# Patient Record
Sex: Female | Born: 1975 | Race: White | Hispanic: No | Smoking: Current every day smoker
Health system: Southern US, Community
[De-identification: ages and names within clinical notes are randomized; demographics above are authoritative.]

## PROBLEM LIST (undated history)

## (undated) DIAGNOSIS — T7840XA Allergy, unspecified, initial encounter: Secondary | ICD-10-CM

## (undated) DIAGNOSIS — G5600 Carpal tunnel syndrome, unspecified upper limb: Secondary | ICD-10-CM

## (undated) DIAGNOSIS — L309 Dermatitis, unspecified: Secondary | ICD-10-CM

## (undated) DIAGNOSIS — F419 Anxiety disorder, unspecified: Secondary | ICD-10-CM

## (undated) DIAGNOSIS — M1990 Unspecified osteoarthritis, unspecified site: Secondary | ICD-10-CM

## (undated) DIAGNOSIS — F4310 Post-traumatic stress disorder, unspecified: Secondary | ICD-10-CM

## (undated) DIAGNOSIS — F32A Depression, unspecified: Secondary | ICD-10-CM

## (undated) DIAGNOSIS — G709 Myoneural disorder, unspecified: Secondary | ICD-10-CM

## (undated) DIAGNOSIS — G9389 Other specified disorders of brain: Secondary | ICD-10-CM

## (undated) DIAGNOSIS — F909 Attention-deficit hyperactivity disorder, unspecified type: Secondary | ICD-10-CM

## (undated) DIAGNOSIS — K589 Irritable bowel syndrome without diarrhea: Secondary | ICD-10-CM

## (undated) HISTORY — DX: Depression, unspecified: F32.A

## (undated) HISTORY — DX: Carpal tunnel syndrome, unspecified upper limb: G56.00

## (undated) HISTORY — DX: Allergy, unspecified, initial encounter: T78.40XA

## (undated) HISTORY — DX: Post-traumatic stress disorder, unspecified: F43.10

## (undated) HISTORY — DX: Anxiety disorder, unspecified: F41.9

## (undated) HISTORY — DX: Unspecified osteoarthritis, unspecified site: M19.90

## (undated) HISTORY — DX: Dermatitis, unspecified: L30.9

## (undated) HISTORY — DX: Irritable bowel syndrome, unspecified: K58.9

## (undated) HISTORY — DX: Attention-deficit hyperactivity disorder, unspecified type: F90.9

## (undated) HISTORY — DX: Myoneural disorder, unspecified: G70.9

## (undated) HISTORY — DX: Other specified disorders of brain: G93.89

---

## 2004-07-22 ENCOUNTER — Emergency Department: Payer: Self-pay | Admitting: Emergency Medicine

## 2004-07-31 ENCOUNTER — Emergency Department: Payer: Self-pay | Admitting: Unknown Physician Specialty

## 2005-12-28 ENCOUNTER — Emergency Department: Payer: Self-pay | Admitting: Unknown Physician Specialty

## 2006-04-11 ENCOUNTER — Emergency Department: Payer: Self-pay
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
## 2007-02-13 ENCOUNTER — Emergency Department: Payer: Self-pay | Admitting: Emergency Medicine

## 2007-04-29 ENCOUNTER — Emergency Department: Payer: Self-pay | Admitting: Emergency Medicine

## 2007-04-29 ENCOUNTER — Other Ambulatory Visit: Payer: Self-pay

## 2007-05-17 ENCOUNTER — Ambulatory Visit: Payer: Self-pay | Admitting: Internal Medicine

## 2007-07-18 ENCOUNTER — Emergency Department: Payer: Self-pay | Admitting: Emergency Medicine

## 2007-07-30 ENCOUNTER — Emergency Department: Payer: Self-pay | Admitting: Emergency Medicine

## 2007-07-30 ENCOUNTER — Other Ambulatory Visit: Payer: Self-pay

## 2007-08-02 ENCOUNTER — Emergency Department: Payer: Self-pay | Admitting: Emergency Medicine

## 2007-08-14 ENCOUNTER — Emergency Department: Payer: Self-pay | Admitting: Emergency Medicine

## 2007-08-19 ENCOUNTER — Emergency Department: Payer: Self-pay | Admitting: Internal Medicine

## 2007-10-02 ENCOUNTER — Emergency Department: Payer: Self-pay | Admitting: Emergency Medicine
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
## 2009-07-18 IMAGING — US ABDOMEN ULTRASOUND
1 series · 17 of 25 positions shown · non-contrast
Comparison: none

REASON FOR EXAM: RUQ pain, nausea and vomiting
COMMENTS:

[Series 1: abdomen ultrasound · 17 of 55 slices shown]
[im 1/55]
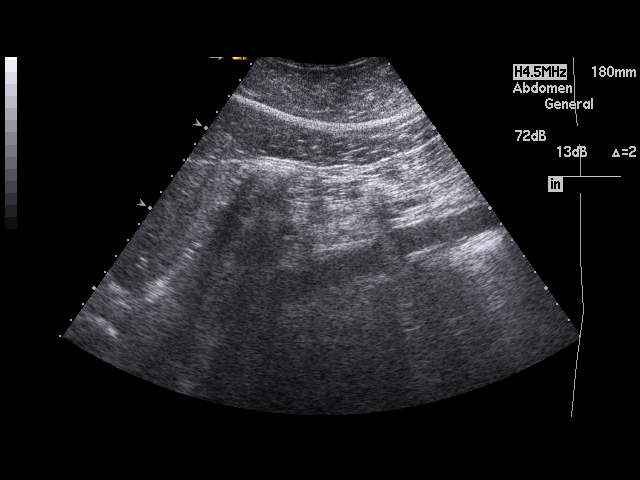
[im 5/55]
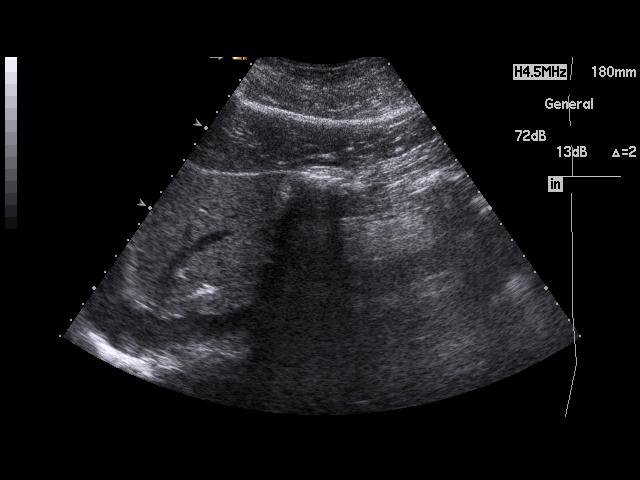
[im 7/55]
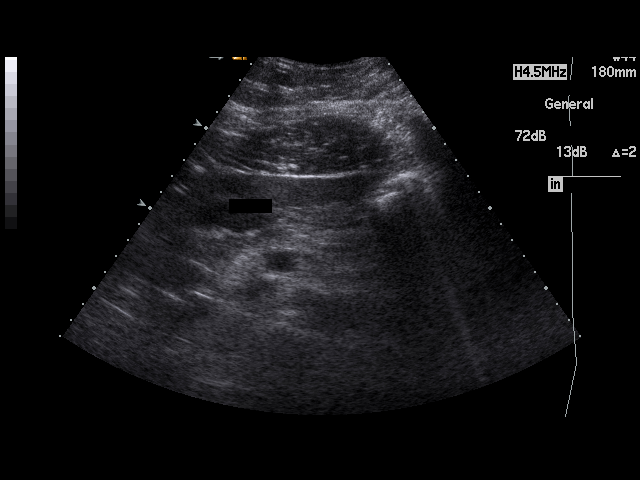
[im 12/55]
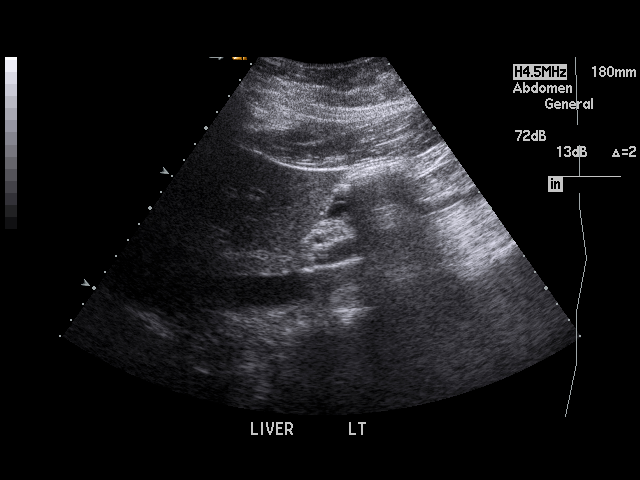
[im 14/55]
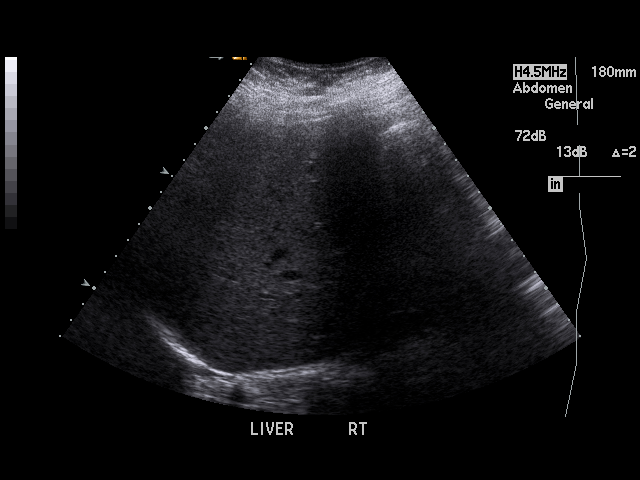
[im 19/55]
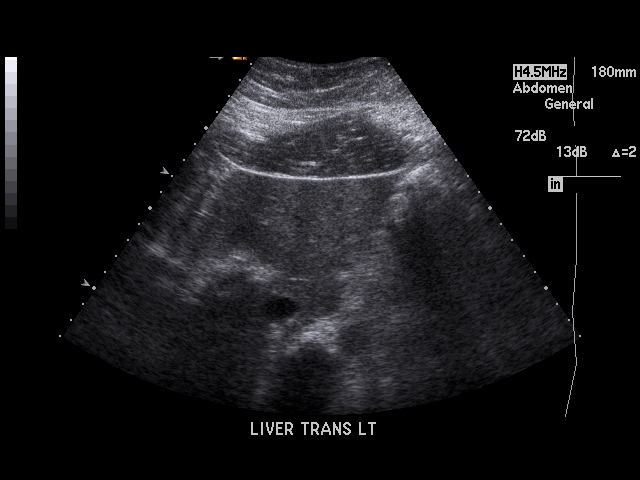
[im 21/55]
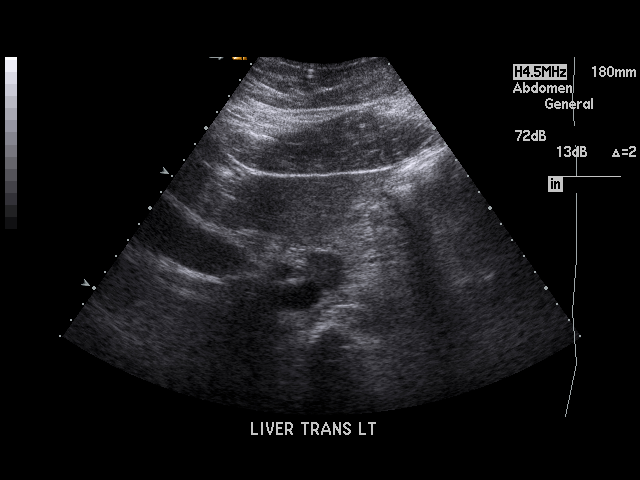
[im 25/55]
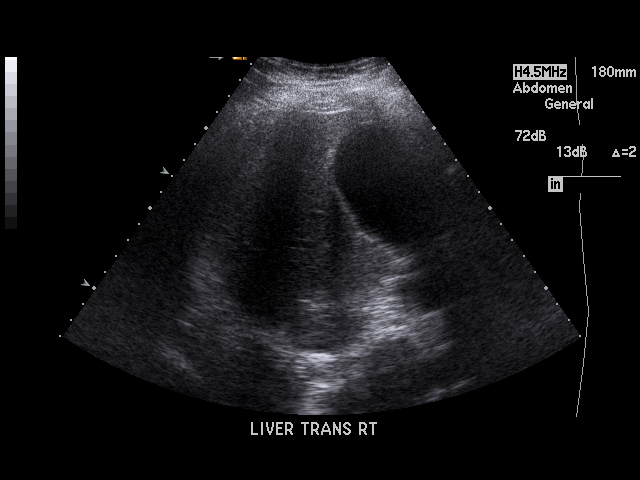
[im 28/55]
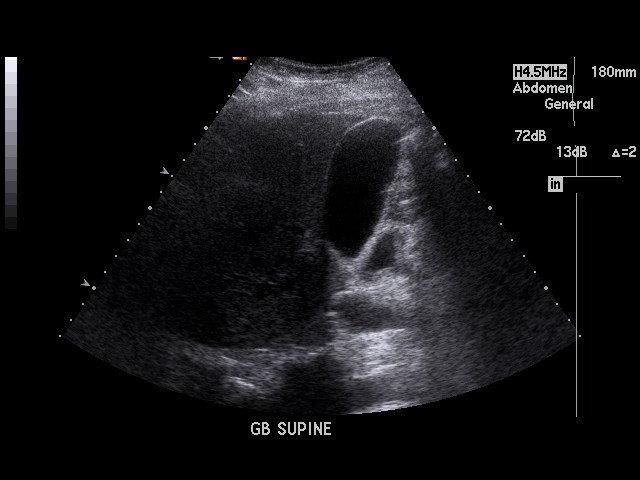
[im 30/55]
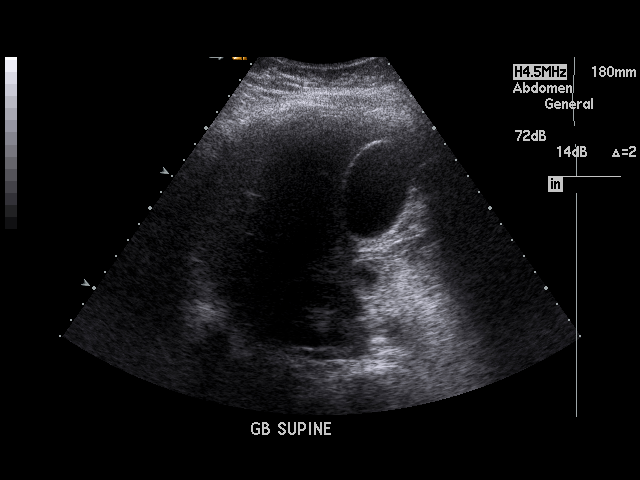
[im 34/55]
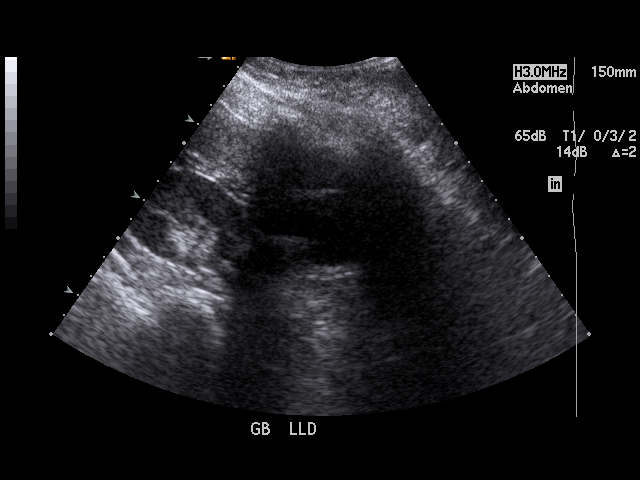
[im 37/55]
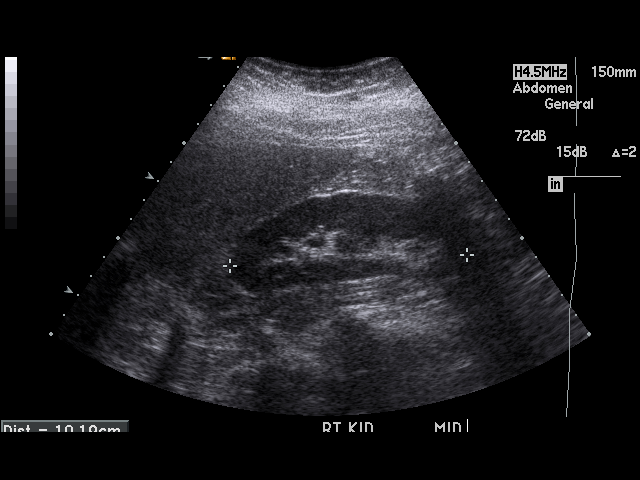
[im 41/55]
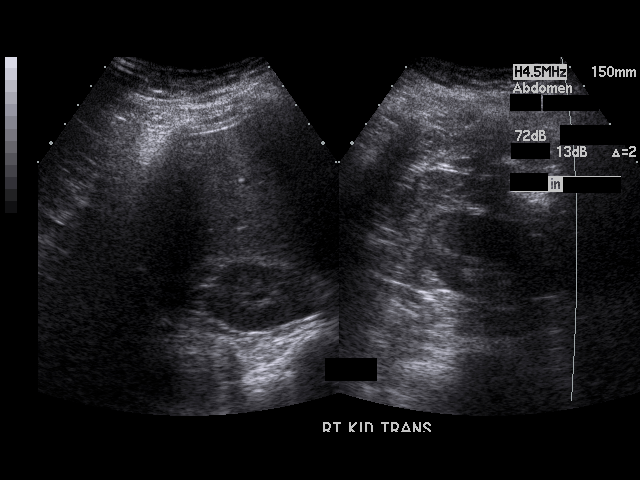
[im 43/55]
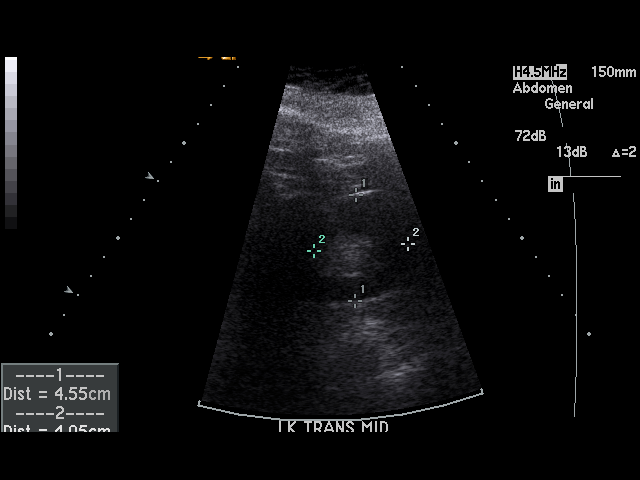
[im 48/55]
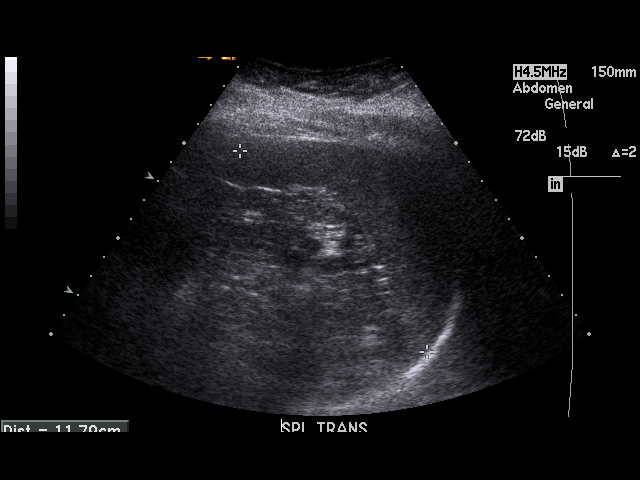
[im 50/55]
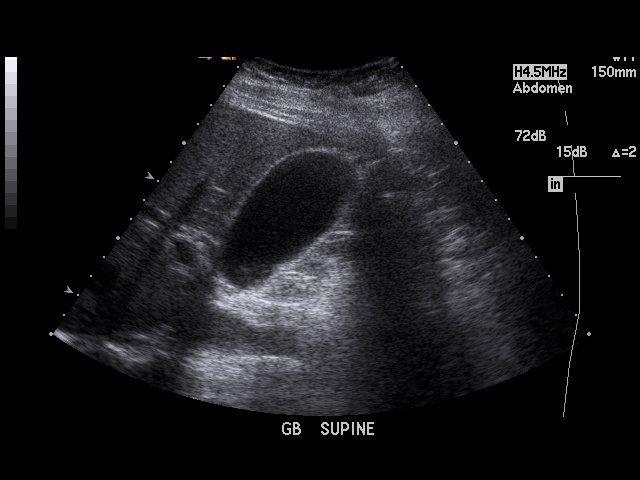
[im 55/55]
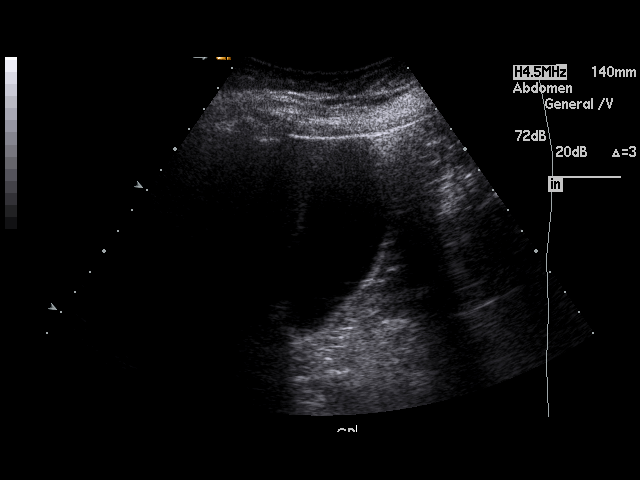

[17 of 25 positions shown; findings below may reference images not displayed]

PROCEDURE:     US  - US ABDOMEN GENERAL SURVEY  - August 02, 2007  [DATE]

RESULT:     The liver, spleen, pancreas, abdominal aorta and inferior vena
cava show no significant abnormalities. No gallstones are seen. There is no
thickening of the gallbladder wall. The common bile duct measures 3.4 mm in
diameter which is within normal limits. The kidneys show no hydronephrosis.
There is no ascites.
IMPRESSION: No significant abnormalities are noted.

## 2010-12-24 ENCOUNTER — Emergency Department: Payer: Self-pay | Admitting: Emergency Medicine

## 2011-09-19 ENCOUNTER — Emergency Department: Payer: Self-pay | Admitting: Emergency Medicine

## 2011-12-03 ENCOUNTER — Emergency Department: Payer: Self-pay | Admitting: Emergency Medicine

## 2012-06-02 ENCOUNTER — Emergency Department: Payer: Self-pay | Admitting: Unknown Physician Specialty

## 2012-06-02 LAB — CBC
HGB: 13.2 g/dL (ref 12.0–16.0)
MCHC: 33.4 g/dL (ref 32.0–36.0)
MCV: 91 fL (ref 80–100)
Platelet: 291 10*3/uL (ref 150–440)

## 2012-06-03 LAB — COMPREHENSIVE METABOLIC PANEL
Albumin: 3.7 g/dL (ref 3.4–5.0)
Alkaline Phosphatase: 77 U/L (ref 50–136)
Anion Gap: 8 (ref 7–16)
Bilirubin,Total: 0.1 mg/dL — ABNORMAL LOW (ref 0.2–1.0)
Co2: 23 mmol/L (ref 21–32)
Creatinine: 1.04 mg/dL (ref 0.60–1.30)
EGFR (Non-African Amer.): >60
Glucose: 91 mg/dL (ref 65–99)
Osmolality: 281 (ref 275–301)
Total Protein: 7.6 g/dL (ref 6.4–8.2)

## 2012-06-03 LAB — URINALYSIS, COMPLETE
Bacteria: NONE SEEN
RBC,UR: 82426 /HPF (ref 0–5)
Squamous Epithelial: 17
WBC UR: 73 /HPF (ref 0–5)

## 2012-11-22 DIAGNOSIS — Y249XXA Unspecified firearm discharge, undetermined intent, initial encounter: Secondary | ICD-10-CM | POA: Insufficient documentation

## 2013-04-18 LAB — BASIC METABOLIC PANEL
BUN: 14 mg/dL (ref 4–21)
Creatinine: 1 mg/dL (ref 0.5–1.1)
GLUCOSE: 87 mg/dL
Potassium: 4 mmol/L (ref 3.4–5.3)
Sodium: 140 mmol/L (ref 137–147)

## 2013-04-18 LAB — CBC AND DIFFERENTIAL
HEMATOCRIT: 40 % (ref 36–46)
HEMOGLOBIN: 13.3 g/dL (ref 12.0–16.0)
Neutrophils Absolute: 6 /uL
Platelets: 348 10*3/uL (ref 150–399)
WBC: 9.1 10^3/mL

## 2013-04-18 LAB — LIPID PANEL
Cholesterol: 163 mg/dL (ref 0–200)
HDL: 40 mg/dL (ref 35–70)
LDL CALC: 105 mg/dL
Triglycerides: 92 mg/dL (ref 40–160)

## 2013-04-18 LAB — HEPATIC FUNCTION PANEL
ALT: 13 U/L (ref 7–35)
AST: 12 U/L — AB (ref 13–35)
Alkaline Phosphatase: 65 U/L (ref 25–125)
Bilirubin, Total: 0.2 mg/dL

## 2013-04-18 LAB — TSH: TSH: 1.24 u[IU]/mL (ref 0.41–5.90)

## 2013-12-07 ENCOUNTER — Ambulatory Visit: Payer: Self-pay

## 2016-04-02 ENCOUNTER — Emergency Department
Admission: EM | Admit: 2016-04-02 | Discharge: 2016-04-02 | Disposition: A | Discharge status: Home / Self Care | Payer: Medicaid Other | Attending: Emergency Medicine | Admitting: Emergency Medicine

## 2016-04-02 ENCOUNTER — Encounter: Payer: Self-pay | Admitting: Emergency Medicine

## 2016-04-02 DIAGNOSIS — F909 Attention-deficit hyperactivity disorder, unspecified type: Secondary | ICD-10-CM | POA: Insufficient documentation

## 2016-04-02 DIAGNOSIS — F17210 Nicotine dependence, cigarettes, uncomplicated: Secondary | ICD-10-CM | POA: Insufficient documentation

## 2016-04-02 DIAGNOSIS — A084 Viral intestinal infection, unspecified: Secondary | ICD-10-CM | POA: Diagnosis not present

## 2016-04-02 DIAGNOSIS — R197 Diarrhea, unspecified: Secondary | ICD-10-CM | POA: Diagnosis present

## 2016-04-02 DIAGNOSIS — R112 Nausea with vomiting, unspecified: Secondary | ICD-10-CM

## 2016-04-02 MED ORDER — ONDANSETRON 4 MG PO TBDP: 4.0000 mg | ORAL_TABLET | Freq: Three times a day (TID) | ORAL | 0 refills | Status: DC | PRN

## 2016-04-02 MED ORDER — ONDANSETRON 4 MG PO TBDP
ORAL_TABLET | ORAL | Status: AC
  Filled 2016-04-02: qty 1

## 2016-04-02 MED ORDER — ONDANSETRON 4 MG PO TBDP
4.0000 mg | ORAL_TABLET | Freq: Once | ORAL | Status: AC
  Administered 2016-04-02: 4 mg via ORAL

## 2016-04-02 NOTE — Discharge Instructions (Signed)
Rest. Push fluids.   Return to the emergency department if any worsening, onset of blood in your stools, inability to take fluids by mouth.  Please follow up with your PCP or [facility] walk in for recheck in 1-2 days.

## 2016-04-02 NOTE — ED Triage Notes (Signed)
Reports n/v/d this am.  Pt states "im only here bc my boss said I have to have a work note."

## 2016-04-02 NOTE — ED Provider Notes (Signed)
[facility] Emergency Department Provider Note  ____________________________________________  Time seen: Approximately 11:52 AM  I have reviewed the triage vital signs and the nursing notes.   HISTORY  Chief Complaint Nausea    HPI Kaylee Mcguire is a 41 y.o. female , NAD, presents to the department for evaluation of nausea, vomiting and diarrhea. Patient states she had sudden onset nausea, vomiting and diarrhea at 5:30 this morning. He attempted to go to work at [employer] and was sent to the emergency department by her employer for evaluation. Patient states she has had some abdominal cramping which seems to be alleviated with bowel movement. Denies any mucous or blood in the emesis or her stool. States that a child in her home has also had the same symptoms and diagnosed with viral gastroenteritis. Denies any fevers but has had chills. Denies body aches, cough, chest congestion, chest pain, shortness of breath or wheezing. Has had no headaches, sinus pressure, ear pain or sore throat.   Past Medical History:  Diagnosis Date  . ADHD (attention deficit hyperactivity disorder)   . Eczema   . IBS (irritable bowel syndrome)   . PTSD (post-traumatic stress disorder)     There are no active problems to display for this patient.   History reviewed. No pertinent surgical history.  Prior to Admission medications   Medication Sig Start Date End Date Taking? Authorizing Provider  ondansetron (ZOFRAN ODT) 4 MG disintegrating tablet Take 1 tablet (4 mg total) by mouth every 8 (eight) hours as needed for nausea or vomiting. 04/02/16   Lyriq Jarchow L Shayda Kalka, PA-C    Allergies Codeine  Family History  Problem Relation Age of Onset  . Heart disease Father     Social History Social History  Substance Use Topics  . Smoking status: Current Every Day Smoker    Packs/day: 0.50    Types: Cigarettes  . Smokeless tobacco: Never Used  . Alcohol use No     Review of  Systems  Constitutional: Positive chills. No fever, rigors. Eyes: No visual changes.  ENT: No sore throat, nasal congestion, sinus pressure, runny nose. Cardiovascular: No chest pain, palpitations. Respiratory: No cough or chest congestion. No shortness of breath. No wheezing.  Gastrointestinal: Positive abdominal cramping that alleviates with bowel movement. Positive nausea, vomiting and diarrhea. No hematemesis or hematochezia. Constipation. Genitourinary: Negative for dysuria, hematuria. No urinary hesitancy, urgency or increased frequency. Musculoskeletal: Negative for back pain nor general myalgias.  Skin: Negative for rash. Neurological: Negative for headaches, focal weakness or numbness. 10-point ROS otherwise negative.  ____________________________________________   PHYSICAL EXAM:  VITAL SIGNS: ED Triage Vitals  Enc Vitals Group     BP 04/02/16 0924 (!) 139/94     Pulse Rate 04/02/16 0924 89     Resp 04/02/16 0924 18     Temp 04/02/16 0924 98 F (36.7 C)     Temp Source 04/02/16 0924 Oral     SpO2 04/02/16 0924 99 %     Weight 04/02/16 0925 235 lb (106.6 kg)     Height 04/02/16 0925 5\' 3"  (1.6 m)     Head Circumference --      Peak Flow --      Pain Score 04/02/16 0925 0     Pain Loc --      Pain Edu? --      Excl. in GC? --      Constitutional: Alert and oriented. Ill appearing but in no acute distress. Eyes: Conjunctivae are normal  without icterus, injection or discharge. Head: Atraumatic. Neck: Supple with full range of motion. Hematological/Lymphatic/Immunilogical: No cervical lymphadenopathy. Cardiovascular: Normal rate, regular rhythm. Normal S1 and S2.  No murmurs, rubs, gallops. Good peripheral circulation. Respiratory: Normal respiratory effort without tachypnea or retractions. Lungs CTAB with breath sounds noted in all lung fields. No wheeze, rhonchi, rales. Gastrointestinal: Soft and nontender without distention or guarding in all quadrants. No rebound  or rigidity. No masses or hepatosplenomegaly. Bowel sounds are hyperactive in the lower regions. Musculoskeletal: No lower extremity tenderness nor edema.  No joint effusions. Neurologic:  Normal speech and language. Auralgan posture. No gross focal neurologic deficits are appreciated.  Skin:  Skin is warm, dry and intact. No rash noted. Psychiatric: Mood and affect are normal. Speech and behavior are normal. Patient exhibits appropriate insight and judgement.   ____________________________________________   LABS  None ____________________________________________  EKG  None ____________________________________________  RADIOLOGY  None ____________________________________________    PROCEDURES  Procedure(s) performed: None   Procedures   Medications  ondansetron (ZOFRAN-ODT) disintegrating tablet 4 mg (4 mg Oral Given 04/02/16 [id number])   Patient notes no further nausea after being given Zofran ODT.  ____________________________________________   INITIAL IMPRESSION / ASSESSMENT AND PLAN / ED COURSE  Pertinent labs & imaging results that were available during my care of the patient were reviewed by me and considered in my medical decision making (see chart for details).  Clinical Course     Patient's diagnosis is consistent with Nausea, vomiting and diarrhea caused by viral gastroenteritis. Patient will be discharged home with prescriptions for Zofran ODT to take as needed for nausea and vomiting. Patient is encouraged to continue oral intake of water and Gatorade and follow a care as given. Patient is to follow up with her primary care provider in one to 2 days for recheck. Patient is given strict ED precautions to return to the ED for any worsening or new symptoms.    ____________________________________________  FINAL CLINICAL IMPRESSION(S) / ED DIAGNOSES  Final diagnoses:  Nausea vomiting and diarrhea  Viral gastroenteritis      NEW MEDICATIONS STARTED  DURING THIS VISIT:  Discharge Medication List as of 04/02/2016 11:58 AM    START taking these medications   Details  ondansetron (ZOFRAN ODT) 4 MG disintegrating tablet Take 1 tablet (4 mg total) by mouth every 8 (eight) hours as needed for nausea or vomiting., Starting Fri 04/02/2016, Print             Ernestene KielJami L WarrenHagler, PA-C 04/02/16 1733    Emily FilbertJonathan E Williams, MD 04/03/16 1501

## 2016-04-02 NOTE — ED Notes (Signed)
Patient was called for in the designated waiting area but patient did not answer. 

## 2016-07-20 ENCOUNTER — Emergency Department
Admission: EM | Admit: 2016-07-20 | Discharge: 2016-07-20 | Disposition: A | Discharge status: Home / Self Care | Payer: Medicaid Other | Attending: Emergency Medicine | Admitting: Emergency Medicine

## 2016-07-20 ENCOUNTER — Emergency Department: Payer: Medicaid Other

## 2016-07-20 ENCOUNTER — Encounter: Payer: Self-pay | Admitting: Emergency Medicine

## 2016-07-20 DIAGNOSIS — F17210 Nicotine dependence, cigarettes, uncomplicated: Secondary | ICD-10-CM | POA: Insufficient documentation

## 2016-07-20 DIAGNOSIS — R109 Unspecified abdominal pain: Secondary | ICD-10-CM | POA: Diagnosis present

## 2016-07-20 DIAGNOSIS — Z79899 Other long term (current) drug therapy: Secondary | ICD-10-CM | POA: Diagnosis not present

## 2016-07-20 DIAGNOSIS — F909 Attention-deficit hyperactivity disorder, unspecified type: Secondary | ICD-10-CM | POA: Diagnosis not present

## 2016-07-20 DIAGNOSIS — N200 Calculus of kidney: Secondary | ICD-10-CM | POA: Insufficient documentation

## 2016-07-20 LAB — COMPREHENSIVE METABOLIC PANEL
ALBUMIN: 3.9 g/dL (ref 3.5–5.0)
ALT: 16 U/L (ref 14–54)
AST: 20 U/L (ref 15–41)
Alkaline Phosphatase: 56 U/L (ref 38–126)
Anion gap: 7 (ref 5–15)
BILIRUBIN TOTAL: 0.6 mg/dL (ref 0.3–1.2)
BUN: 15 mg/dL (ref 6–20)
CO2: 28 mmol/L (ref 22–32)
Calcium: 9.1 mg/dL (ref 8.9–10.3)
Chloride: 105 mmol/L (ref 101–111)
Creatinine, Ser: 0.87 mg/dL (ref 0.44–1.00)
GFR calc Af Amer: >60 mL/min (ref >60)
GFR calc non Af Amer: >60 mL/min (ref >60)
GLUCOSE: 100 mg/dL — AB (ref 65–99)
POTASSIUM: 4.2 mmol/L (ref 3.5–5.1)
Sodium: 140 mmol/L (ref 135–145)
TOTAL PROTEIN: 7.5 g/dL (ref 6.5–8.1)

## 2016-07-20 LAB — URINALYSIS, COMPLETE (UACMP) WITH MICROSCOPIC
BACTERIA UA: NONE SEEN
Bilirubin Urine: NEGATIVE
Glucose, UA: NEGATIVE mg/dL
Ketones, ur: NEGATIVE mg/dL
Leukocytes, UA: NEGATIVE
NITRITE: NEGATIVE
PROTEIN: 100 mg/dL — AB
Specific Gravity, Urine: 1.021 (ref 1.005–1.030)
pH: 6 (ref 5.0–8.0)

## 2016-07-20 LAB — CBC
HEMATOCRIT: 40.7 % (ref 35.0–47.0)
Hemoglobin: 13.7 g/dL (ref 12.0–16.0)
MCH: 29.8 pg (ref 26.0–34.0)
MCHC: 33.7 g/dL (ref 32.0–36.0)
MCV: 88.5 fL (ref 80.0–100.0)
Platelets: 329 10*3/uL (ref 150–440)
RBC: 4.6 MIL/uL (ref 3.80–5.20)
RDW: 13.8 % (ref 11.5–14.5)
WBC: 10.5 10*3/uL (ref 3.6–11.0)

## 2016-07-20 LAB — LIPASE, BLOOD: Lipase: 29 U/L (ref 11–51)

## 2016-07-20 LAB — POCT PREGNANCY, URINE: PREG TEST UR: NEGATIVE

## 2016-07-20 MED ORDER — KETOROLAC TROMETHAMINE 30 MG/ML IJ SOLN
30.0000 mg | Freq: Once | INTRAMUSCULAR | Status: AC
  Administered 2016-07-20: 30 mg via INTRAVENOUS
  Filled 2016-07-20: qty 1

## 2016-07-20 MED ORDER — ONDANSETRON HCL 4 MG/2ML IJ SOLN
4.0000 mg | Freq: Once | INTRAMUSCULAR | Status: AC
  Administered 2016-07-20: 4 mg via INTRAVENOUS
  Filled 2016-07-20: qty 2

## 2016-07-20 MED ORDER — ONDANSETRON HCL 4 MG/2ML IJ SOLN
4.0000 mg | Freq: Once | INTRAMUSCULAR | Status: AC | PRN
  Administered 2016-07-20: 4 mg via INTRAVENOUS
  Filled 2016-07-20: qty 2

## 2016-07-20 MED ORDER — ONDANSETRON HCL 4 MG PO TABS: 4.0000 mg | ORAL_TABLET | Freq: Every day | ORAL | 1 refills | Status: AC | PRN

## 2016-07-20 MED ORDER — MORPHINE SULFATE (PF) 4 MG/ML IV SOLN
4.0000 mg | Freq: Once | INTRAVENOUS | Status: AC
  Administered 2016-07-20: 4 mg via INTRAVENOUS
  Filled 2016-07-20: qty 1

## 2016-07-20 MED ORDER — SODIUM CHLORIDE 0.9 % IV BOLUS (SEPSIS)
1000.0000 mL | Freq: Once | INTRAVENOUS | Status: AC
  Administered 2016-07-20: 1000 mL via INTRAVENOUS

## 2016-07-20 MED ORDER — FENTANYL CITRATE (PF) 100 MCG/2ML IJ SOLN
50.0000 ug | INTRAMUSCULAR | Status: DC | PRN
  Administered 2016-07-20: 50 ug via INTRAVENOUS
  Filled 2016-07-20: qty 2

## 2016-07-20 MED ORDER — TAMSULOSIN HCL 0.4 MG PO CAPS: 0.4000 mg | ORAL_CAPSULE | Freq: Every day | ORAL | 0 refills | Status: DC

## 2016-07-20 MED ORDER — OXYCODONE-ACETAMINOPHEN 5-325 MG PO TABS: 1.0000 | ORAL_TABLET | Freq: Four times a day (QID) | ORAL | 0 refills | Status: DC | PRN

## 2016-07-20 NOTE — Discharge Instructions (Signed)
Please follow up with urology for further evaluation of your kidney stone.  °

## 2016-07-20 NOTE — ED Notes (Signed)
Pt discharged home after verbalizing understanding of discharge instructions; nad noted. 

## 2016-07-20 NOTE — ED Provider Notes (Signed)
[facility] Emergency Department Provider Note   ____________________________________________   First MD Initiated Contact with Patient 07/20/16 0259     (approximate)  I have reviewed the triage vital signs and the nursing notes.   HISTORY  Chief Complaint Abdominal Pain    HPI Kaylee Mcguire is a 41 y.o. female who comes into the hospital today with some right-sided abdominal and flank pain. The patient woke up at 2 AM with sharp pain in her right side. The patient thought it was due to gas. She had a bowel movement reports that the pain is getting worse and worse. The patient did not take anything for pain. She rates her pain a 10 out of 10 in intensity. She's had some nausea with no vomiting. The pain also wraps around to her back. The patient has never had anything like this in the past. She does have a history of ovarian cysts and fibroids. The patient's last menstrual period was last week. She has stabbing pain but denies pain with urination or blood in her urine. The patient's here today for evaluation.   Past Medical History:  Diagnosis Date  . ADHD (attention deficit hyperactivity disorder)   . Eczema   . IBS (irritable bowel syndrome)   . PTSD (post-traumatic stress disorder)     There are no active problems to display for this patient.   History reviewed. No pertinent surgical history.  Prior to Admission medications   Medication Sig Start Date End Date Taking? Authorizing Provider  ondansetron (ZOFRAN ODT) 4 MG disintegrating tablet Take 1 tablet (4 mg total) by mouth every 8 (eight) hours as needed for nausea or vomiting. 04/02/16   Jami L Hagler, PA-C  ondansetron (ZOFRAN) 4 MG tablet Take 1 tablet (4 mg total) by mouth daily as needed for nausea or vomiting. 07/20/16 07/20/17  Rebecka Apley, MD  oxyCODONE-acetaminophen (ROXICET) 5-325 MG tablet Take 1 tablet by mouth every 6 (six) hours as needed. 07/20/16   Rebecka Apley, MD    tamsulosin (FLOMAX) 0.4 MG CAPS capsule Take 1 capsule (0.4 mg total) by mouth daily. 07/20/16   Rebecka Apley, MD    Allergies Codeine  Family History  Problem Relation Age of Onset  . Heart disease Father     Social History Social History  Substance Use Topics  . Smoking status: Current Every Day Smoker    Packs/day: 0.50    Types: Cigarettes  . Smokeless tobacco: Never Used  . Alcohol use No    Review of Systems  Constitutional: No fever/chills Eyes: No visual changes. ENT: No sore throat. Cardiovascular: Denies chest pain. Respiratory: Denies shortness of breath. Gastrointestinal:  abdominal pain, nausea, no vomiting.  No diarrhea.  No constipation. Genitourinary: Negative for dysuria. Musculoskeletal: Right flank pain Skin: Negative for rash. Neurological: Negative for headaches, focal weakness or numbness.   ____________________________________________   PHYSICAL EXAM:  VITAL SIGNS: ED Triage Vitals  Enc Vitals Group     BP 07/20/16 0226 (!) 159/107     Pulse Rate 07/20/16 0226 79     Resp 07/20/16 0226 (!) 22     Temp 07/20/16 0226 98.2 F (36.8 C)     Temp Source 07/20/16 0226 Oral     SpO2 07/20/16 0226 99 %     Weight 07/20/16 0227 255 lb (115.7 kg)     Height 07/20/16 0227  (1.6 m)     Head Circumference --      Peak  Flow --      Pain Score 07/20/16 0226 10     Pain Loc --      Pain Edu? --      Excl. in GC? --     Constitutional: Alert and oriented. Well appearing and in Moderate distress. Eyes: Conjunctivae are normal. PERRL. EOMI. Head: Atraumatic. Nose: No congestion/rhinnorhea. Mouth/Throat: Mucous membranes are moist.  Oropharynx non-erythematous. Cardiovascular: Normal rate, regular rhythm. Grossly normal heart sounds.  Good peripheral circulation. Respiratory: Normal respiratory effort.  No retractions. Lungs CTAB. Gastrointestinal: Soft with some right sided abdominal tenderness to palpation No distention. Positive bowel  sounds right CVA tenderness palpation Musculoskeletal: No lower extremity tenderness nor edema.   Neurologic:  Normal speech and language.  Skin:  Skin is warm, dry and intact.  Psychiatric: Mood and affect are normal.   ____________________________________________   LABS (all labs ordered are listed, but only abnormal results are displayed)  Labs Reviewed  COMPREHENSIVE METABOLIC PANEL - Abnormal; Notable for the following:       Result Value   Glucose, Bld 100 (*)    All other components within normal limits  URINALYSIS, COMPLETE (UACMP) WITH MICROSCOPIC - Abnormal; Notable for the following:    Color, Urine AMBER (*)    APPearance CLOUDY (*)    Hgb urine dipstick LARGE (*)    Protein, ur 100 (*)    Squamous Epithelial / LPF 6-30 (*)    All other components within normal limits  LIPASE, BLOOD  CBC  POC URINE PREG, ED  POCT PREGNANCY, URINE   ____________________________________________  EKG  none ____________________________________________  RADIOLOGY  CT renal stone study ____________________________________________   PROCEDURES  Procedure(s) performed: None  Procedures  Critical Care performed: No  ____________________________________________   INITIAL IMPRESSION / ASSESSMENT AND PLAN / ED COURSE  Pertinent labs & imaging results that were available during my care of the patient were reviewed by me and considered in my medical decision making (see chart for details).  This is a 41 year old female who comes into the hospital today with some right sided abdominal pain and flank pain. I will send the patient for a CT scan looking for possible kidney stone. The patient did receive a dose of morphine when she initially arrived. I will give the patient some normal saline as well.  Clinical Course as of Jul 20 656  Tue Jul 20, 2016  0601 1. Obstructing 2.5 mm proximal right ureteral calculus at the L3-4 level with marked hydronephrosis. 2. Nonobstructing 4  x 5 mm lower pole left collecting system calculus. 3. Small fat containing umbilical and left inguinal hernias.   CT Renal Soundra Pilon [AW]    Clinical Course User Index [AW] Rebecka Apley, MD    The patient reports that she is feeling a little bit better. She does have an obstructing 2.5 mm proximal right ureteral stone the patient did receive a dose of Toradol. Her urine does not show any infection. The patient will be discharged home when she has completed her normal saline bolus. ____________________________________________   FINAL CLINICAL IMPRESSION(S) / ED DIAGNOSES  Final diagnoses:  Kidney stone      NEW MEDICATIONS STARTED DURING THIS VISIT:  New Prescriptions   ONDANSETRON (ZOFRAN) 4 MG TABLET    Take 1 tablet (4 mg total) by mouth daily as needed for nausea or vomiting.   OXYCODONE-ACETAMINOPHEN (ROXICET) 5-325 MG TABLET    Take 1 tablet by mouth every 6 (six) hours as needed.   TAMSULOSIN (  FLOMAX) 0.4 MG CAPS CAPSULE    Take 1 capsule (0.4 mg total) by mouth daily.     Note:  This document was prepared using Dragon voice recognition software and may include unintentional dictation errors.    Rebecka Apley, MD 07/20/16 [phone]

## 2016-07-20 NOTE — ED Triage Notes (Signed)
Patient to ER for c/o severe RLQ abd pain. Patient states pain woke her from sleep. States when she went to bed she didn't have pain, but felt like she may have had gas. Patient tearful in triage.

## 2016-08-18 ENCOUNTER — Ambulatory Visit (INDEPENDENT_AMBULATORY_CARE_PROVIDER_SITE_OTHER): Payer: Medicaid Other | Admitting: Urology

## 2016-08-18 ENCOUNTER — Encounter: Payer: Self-pay | Admitting: Urology

## 2016-08-18 VITALS — BP 126/81 | HR 88 | Ht 63.0 in | Wt 253.9 lb

## 2016-08-18 DIAGNOSIS — N200 Calculus of kidney: Secondary | ICD-10-CM | POA: Diagnosis not present

## 2016-08-18 LAB — MICROSCOPIC EXAMINATION

## 2016-08-18 LAB — URINALYSIS, COMPLETE
BILIRUBIN UA: NEGATIVE
Glucose, UA: NEGATIVE
KETONES UA: NEGATIVE
LEUKOCYTES UA: NEGATIVE
Nitrite, UA: NEGATIVE
PH UA: 5 (ref 5.0–7.5)
PROTEIN UA: NEGATIVE
Urobilinogen, Ur: 0.2 mg/dL (ref 0.2–1.0)

## 2016-08-18 MED ORDER — TAMSULOSIN HCL 0.4 MG PO CAPS: 0.4000 mg | ORAL_CAPSULE | Freq: Every day | ORAL | 0 refills | Status: DC

## 2016-08-18 NOTE — Progress Notes (Signed)
08/18/2016 2:33 PM   Kaylee Mcguire 18-Jul-1975 [id number]  Referring provider: [facility] [address] [place], [place] [address]  Chief Complaint  Patient presents with  . Nephrolithiasis    HPI: The patient is a 41 year old female presents for ER follow up after being diagnosed the tune of millimeter right ureteral calculus with hydroureteronephrosis proximal to the sotne. This was seen in the proximal ureter on 07/20/2016. She also had a 5 mm left lower pole stone that is nonobstructing. This is her first stone episode. Her pains is dramatically better at this time and not requiring medication. She doesn't time still have tenderness in her right side. This is similar but not nearly severe as her pain was during her acute stone episode. She has no nausea vomiting or fever or chills at this time. She is currently comfortable. She has no other issues with her kidneys. She denies gross hematuria or UTIs   PMH: Past Medical History:  Diagnosis Date  . ADHD (attention deficit hyperactivity disorder)   . Eczema   . IBS (irritable bowel syndrome)   . PTSD (post-traumatic stress disorder)     Surgical History: History reviewed. No pertinent surgical history.  Home Medications:  Allergies as of 08/18/2016      Reactions   Codeine       Medication List       Accurate as of 08/18/16  2:33 PM. Always use your most recent med list.          cetirizine 10 MG tablet Commonly known as:  ZYRTEC Take 10 mg by mouth daily.   lisinopril 10 MG tablet Commonly known as:  PRINIVIL,ZESTRIL Take 10 mg by mouth daily.   ondansetron 4 MG tablet Commonly known as:  ZOFRAN Take 1 tablet (4 mg total) by mouth daily as needed for nausea or vomiting.   oxyCODONE-acetaminophen 5-325 MG tablet Commonly known as:  ROXICET Take 1 tablet by mouth every 6 (six) hours as needed.   sertraline 50 MG tablet Commonly known as:  ZOLOFT Take 50 mg by mouth  daily.   tamsulosin 0.4 MG Caps capsule Commonly known as:  FLOMAX Take 1 capsule (0.4 mg total) by mouth daily.   tamsulosin 0.4 MG Caps capsule Commonly known as:  FLOMAX Take 1 capsule (0.4 mg total) by mouth daily.   traZODone 100 MG tablet Commonly known as:  DESYREL Take 100 mg by mouth at bedtime.   triamcinolone cream 0.1 % Commonly known as:  KENALOG Apply 1 application topically 2 (two) times daily.       Allergies:  Allergies  Allergen Reactions  . Codeine     Family History: Family History  Problem Relation Age of Onset  . Heart disease Father   . Bladder Cancer Neg Hx   . Kidney cancer Neg Hx     Social History:  reports that she has been smoking Cigarettes.  She has been smoking about 0.50 packs per day. She has never used smokeless tobacco. She reports that she does not drink alcohol or use drugs.  ROS: UROLOGY Frequent Urination?: No Hard to postpone urination?: No Burning/pain with urination?: No Get up at night to urinate?: No Leakage of urine?: No Urine stream starts and stops?: No Trouble starting stream?: No Do you have to strain to urinate?: No Blood in urine?: No Urinary tract infection?: No Sexually transmitted disease?: No Injury to kidneys or bladder?: No Painful intercourse?: No Weak stream?: No Currently pregnant?:  No Vaginal bleeding?: No Last menstrual period?: n  Gastrointestinal Nausea?: No Vomiting?: No Indigestion/heartburn?: No Diarrhea?: No Constipation?: No  Constitutional Fever: No Night sweats?: No Weight loss?: No Fatigue?: No  Skin Skin rash/lesions?: Yes Itching?: No  Eyes Blurred vision?: No Double vision?: No  Ears/Nose/Throat Sore throat?: No Sinus problems?: No  Hematologic/Lymphatic Swollen glands?: No Easy bruising?: No  Cardiovascular Leg swelling?: Yes Chest pain?: No  Respiratory Cough?: No Shortness of breath?: No  Endocrine Excessive thirst?: No  Musculoskeletal Back  pain?: No Joint pain?: No  Neurological Headaches?: Yes Dizziness?: No  Psychologic Depression?: Yes Anxiety?: Yes  Physical Exam: BP 126/81 (BP Location: Left Arm, Patient Position: Sitting, Cuff Size: Normal)   Pulse 88   Ht 5\' 3"  (1.6 m)   Wt 253 lb 14.4 oz (115.2 kg)   BMI 44.98 kg/m   Constitutional:  Alert and oriented, No acute distress. HEENT: [place] AT, moist mucus membranes.  Trachea midline, no masses. Cardiovascular: No clubbing, cyanosis, or edema. Respiratory: Normal respiratory effort, no increased work of breathing. GI: Abdomen is soft, nontender, nondistended, no abdominal masses GU: Mild right CVA tenderness.  Skin: No rashes, bruises or suspicious lesions. Lymph: No cervical or inguinal adenopathy. Neurologic: Grossly intact, no focal deficits, moving all 4 extremities. Psychiatric: Normal mood and affect.  Laboratory Data: Lab Results  Component Value Date   WBC 10.5 07/20/2016   HGB 13.7 07/20/2016   HCT 40.7 07/20/2016   MCV 88.5 07/20/2016   PLT 329 07/20/2016    Lab Results  Component Value Date   CREATININE 0.87 07/20/2016    No results found for: PSA  No results found for: TESTOSTERONE  No results found for: HGBA1C  Urinalysis    Component Value Date/Time   COLORURINE AMBER (A) 07/20/2016 0408   APPEARANCEUR CLOUDY (A) 07/20/2016 0408   APPEARANCEUR CLOUDY 06/02/2012 2323   LABSPEC 1.021 07/20/2016 0408   LABSPEC 1.030 06/02/2012 2323   PHURINE 6.0 07/20/2016 0408   GLUCOSEU NEGATIVE 07/20/2016 0408   GLUCOSEU see comment 06/02/2012 2323   HGBUR LARGE (A) 07/20/2016 0408   BILIRUBINUR NEGATIVE 07/20/2016 0408   BILIRUBINUR see comment 06/02/2012 2323   KETONESUR NEGATIVE 07/20/2016 0408   PROTEINUR 100 (A) 07/20/2016 0408   NITRITE NEGATIVE 07/20/2016 0408   LEUKOCYTESUR NEGATIVE 07/20/2016 0408   LEUKOCYTESUR see comment 06/02/2012 2323    Pertinent Imaging: CT reviewed as above  Assessment & Plan:    1. 2.5 mm right  ureteral stone I discussed treatment options with the patient including medical expulsive therapy, lithotripsy, and ureteroscopy. At this point, she would like to continue with medical expulsive therapy as she is minimally symptomatic. I'm not confident she has passed a stone though it is quite small due to her mild CVA tenderness. Due to the small size, like for her to get a ultrasound in a few weeks to ensure that her hydronephrosis resolves. I will resume her Flomax which she ran out of a few weeks ago. She'll follow-up with us in a few weeks with a renal ultrasound prior. She instructed to call the office if she develops uncontrolled pain or unexplained fevers.  2. 5 mm left lower pole nonobstructing stone -Will need to monitor size with annual KUB  Return in about 2 weeks (around 09/01/2016) for renal u/s prior.  Hildred LaserBrian James Lun Muro, MD  [facility] [address], [address] [place], [place] [address] [phone]

## 2016-09-16 ENCOUNTER — Other Ambulatory Visit: Payer: Self-pay | Admitting: Family Medicine

## 2016-09-17 ENCOUNTER — Ambulatory Visit: Payer: Medicaid Other

## 2016-10-04 ENCOUNTER — Encounter: Payer: Self-pay | Admitting: Emergency Medicine

## 2016-10-04 ENCOUNTER — Emergency Department
Admission: EM | Admit: 2016-10-04 | Discharge: 2016-10-04 | Disposition: A | Discharge status: Home / Self Care | Payer: Self-pay | Attending: Emergency Medicine | Admitting: Emergency Medicine

## 2016-10-04 ENCOUNTER — Emergency Department: Payer: Self-pay

## 2016-10-04 DIAGNOSIS — M25512 Pain in left shoulder: Secondary | ICD-10-CM | POA: Insufficient documentation

## 2016-10-04 DIAGNOSIS — Z79899 Other long term (current) drug therapy: Secondary | ICD-10-CM | POA: Insufficient documentation

## 2016-10-04 DIAGNOSIS — F17210 Nicotine dependence, cigarettes, uncomplicated: Secondary | ICD-10-CM | POA: Insufficient documentation

## 2016-10-04 MED ORDER — TRAMADOL HCL 50 MG PO TABS: 50.0000 mg | ORAL_TABLET | Freq: Four times a day (QID) | ORAL | 0 refills | Status: DC | PRN

## 2016-10-04 MED ORDER — KETOROLAC TROMETHAMINE 60 MG/2ML IM SOLN
60.0000 mg | Freq: Once | INTRAMUSCULAR | Status: AC
  Administered 2016-10-04: 60 mg via INTRAMUSCULAR
  Filled 2016-10-04: qty 2

## 2016-10-04 MED ORDER — NAPROXEN 500 MG PO TABS: 500.0000 mg | ORAL_TABLET | Freq: Two times a day (BID) | ORAL | Status: DC
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
## 2016-10-04 NOTE — Discharge Instructions (Signed)
Wear arm sling for 2-3 days as directed. Take medication as directed.

## 2016-10-04 NOTE — ED Triage Notes (Signed)
Pt presents with fall yesterday at work and had left shoulder pain. No deformity noted and pt does want to file WC.

## 2016-10-04 NOTE — ED Notes (Signed)
Sling applied and pt ambulatory at time of discharge. Pt in NAD. Prescriptions explained and pt verbalized understanding of pain medication and use. Follow up care also explained and understanding verbalized.
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
## 2018-04-13 ENCOUNTER — Emergency Department: Payer: Self-pay

## 2018-04-13 ENCOUNTER — Emergency Department
Admission: EM | Admit: 2018-04-13 | Discharge: 2018-04-14 | Disposition: A | Discharge status: Home / Self Care | Payer: Self-pay | Attending: Emergency Medicine | Admitting: Emergency Medicine

## 2018-04-13 DIAGNOSIS — Z79899 Other long term (current) drug therapy: Secondary | ICD-10-CM | POA: Insufficient documentation

## 2018-04-13 DIAGNOSIS — F17210 Nicotine dependence, cigarettes, uncomplicated: Secondary | ICD-10-CM | POA: Insufficient documentation

## 2018-04-13 DIAGNOSIS — Y9259 Other trade areas as the place of occurrence of the external cause: Secondary | ICD-10-CM | POA: Insufficient documentation

## 2018-04-13 DIAGNOSIS — S39012A Strain of muscle, fascia and tendon of lower back, initial encounter: Secondary | ICD-10-CM | POA: Insufficient documentation

## 2018-04-13 DIAGNOSIS — X58XXXA Exposure to other specified factors, initial encounter: Secondary | ICD-10-CM | POA: Insufficient documentation

## 2018-04-13 DIAGNOSIS — F909 Attention-deficit hyperactivity disorder, unspecified type: Secondary | ICD-10-CM | POA: Insufficient documentation

## 2018-04-13 DIAGNOSIS — Y9389 Activity, other specified: Secondary | ICD-10-CM | POA: Insufficient documentation

## 2018-04-13 DIAGNOSIS — Y999 Unspecified external cause status: Secondary | ICD-10-CM | POA: Insufficient documentation

## 2018-04-13 LAB — BASIC METABOLIC PANEL
Anion gap: 5 (ref 5–15)
BUN: 18 mg/dL (ref 6–20)
CALCIUM: 8.2 mg/dL — AB (ref 8.9–10.3)
CO2: 27 mmol/L (ref 22–32)
CREATININE: 1.06 mg/dL — AB (ref 0.44–1.00)
Chloride: 106 mmol/L (ref 98–111)
GFR calc non Af Amer: >60 mL/min (ref >60)
GLUCOSE: 102 mg/dL — AB (ref 70–99)
Potassium: 4.3 mmol/L (ref 3.5–5.1)
Sodium: 138 mmol/L (ref 135–145)

## 2018-04-13 LAB — CBC
HEMATOCRIT: 41.3 % (ref 36.0–46.0)
Hemoglobin: 13.4 g/dL (ref 12.0–15.0)
MCH: 29.8 pg (ref 26.0–34.0)
MCHC: 32.4 g/dL (ref 30.0–36.0)
MCV: 91.8 fL (ref 80.0–100.0)
Platelets: 312 10*3/uL (ref 150–400)
RBC: 4.5 MIL/uL (ref 3.87–5.11)
RDW: 13.4 % (ref 11.5–15.5)
WBC: 13.8 10*3/uL — AB (ref 4.0–10.5)
nRBC: 0 % (ref 0.0–0.2)

## 2018-04-13 LAB — POCT PREGNANCY, URINE: Preg Test, Ur: NEGATIVE

## 2018-04-13 MED ORDER — HALOPERIDOL LACTATE 5 MG/ML IJ SOLN
2.5000 mg | Freq: Once | INTRAMUSCULAR | Status: AC
  Administered 2018-04-14: 2.5 mg via INTRAVENOUS
  Filled 2018-04-13: qty 1

## 2018-04-13 MED ORDER — KETOROLAC TROMETHAMINE 30 MG/ML IJ SOLN
15.0000 mg | Freq: Once | INTRAMUSCULAR | Status: AC
  Administered 2018-04-14: 15 mg via INTRAVENOUS
  Filled 2018-04-13: qty 1

## 2018-04-13 MED ORDER — FENTANYL CITRATE (PF) 100 MCG/2ML IJ SOLN
100.0000 ug | Freq: Once | INTRAMUSCULAR | Status: AC
  Administered 2018-04-14: 100 ug via INTRAVENOUS
  Filled 2018-04-13: qty 2

## 2018-04-13 MED ORDER — SODIUM CHLORIDE 0.9 % IV BOLUS
1000.0000 mL | Freq: Once | INTRAVENOUS | Status: AC
  Administered 2018-04-14: 1000 mL via INTRAVENOUS

## 2018-04-13 NOTE — ED Triage Notes (Signed)
Patient c/o right flank pain. Patient reports hx of kidney stones.

## 2018-04-13 NOTE — ED Provider Notes (Signed)
[facility] Emergency Department Provider Note  ____________________________________________   First MD Initiated Contact with Patient 04/13/18 2349     (approximate)  I have reviewed the triage vital signs and the nursing notes.   HISTORY  Chief Complaint Flank Pain   HPI Kaylee Mcguire is a 43 y.o. female who comes to the emergency department with gradual onset right flank pain that began while she was at work this evening.  She is concerned because she has a history of kidney stones.  Pain seems to be worse with lifting or stooping and nothing seems to make it better.  She denies dysuria frequency or hesitancy.  She does report some nausea.  She has no history of abdominal surgeries.    Past Medical History:  Diagnosis Date  . ADHD (attention deficit hyperactivity disorder)   . Eczema   . IBS (irritable bowel syndrome)   . PTSD (post-traumatic stress disorder)     There are no active problems to display for this patient.   History reviewed. No pertinent surgical history.  Prior to Admission medications   Medication Sig Start Date End Date Taking? Authorizing Provider  cetirizine (ZYRTEC) 10 MG tablet Take 10 mg by mouth daily.    [provider]  cyclobenzaprine (FLEXERIL) 10 MG tablet Take 1 tablet (10 mg total) by mouth 3 (three) times daily as needed for muscle spasms. 04/14/18   Merrily Brittle, MD  ibuprofen (ADVIL,MOTRIN) 600 MG tablet Take 1 tablet (600 mg total) by mouth every 8 (eight) hours as needed. 04/14/18   Merrily Brittle, MD  lidocaine (LIDODERM) 5 % Place 1 patch onto the skin every 12 (twelve) hours. Remove & Discard patch within 12 hours or as directed by MD 04/14/18 04/14/19  Merrily Brittle, MD  lisinopril (PRINIVIL,ZESTRIL) 10 MG tablet Take 10 mg by mouth daily.    [provider]  naproxen (NAPROSYN) 500 MG tablet Take 1 tablet (500 mg total) by mouth 2 (two) times daily with a meal. 10/04/16   Joni Reining,  PA-C  oxyCODONE-acetaminophen (ROXICET) 5-325 MG tablet Take 1 tablet by mouth every 6 (six) hours as needed. 07/20/16   Rebecka Apley, MD  sertraline (ZOLOFT) 50 MG tablet Take 50 mg by mouth daily.    [provider]  tamsulosin (FLOMAX) 0.4 MG CAPS capsule Take 1 capsule (0.4 mg total) by mouth daily. Patient not taking: Reported on 08/18/2016 07/20/16   Rebecka Apley, MD  tamsulosin (FLOMAX) 0.4 MG CAPS capsule Take 1 capsule (0.4 mg total) by mouth daily. 08/18/16   Hildred Laser, MD  tamsulosin (FLOMAX) 0.4 MG CAPS capsule Take 0.4 mg by mouth.    [provider]  traMADol (ULTRAM) 50 MG tablet Take 1 tablet (50 mg total) by mouth every 6 (six) hours as needed for moderate pain. 10/04/16   Joni Reining, PA-C  traZODone (DESYREL) 100 MG tablet Take 100 mg by mouth at bedtime.    [provider]  triamcinolone cream (KENALOG) 0.1 % Apply 1 application topically 2 (two) times daily.    [provider]    Allergies Codeine  Family History  Problem Relation Age of Onset  . Heart disease Father   . Bladder Cancer Neg Hx   . Kidney cancer Neg Hx     Social History Social History   Tobacco Use  . Smoking status: Current Every Day Smoker    Packs/day: 0.50    Types: Cigarettes  . Smokeless  tobacco: Never Used  Substance Use Topics  . Alcohol use: No  . Drug use: No    Review of Systems Constitutional: No fever/chills Eyes: No visual changes. ENT: No sore throat. Cardiovascular: Denies chest pain. Respiratory: Denies shortness of breath. Gastrointestinal: No abdominal pain.  Positive for nausea, no vomiting.  No diarrhea.  No constipation. Genitourinary: Negative for dysuria. Musculoskeletal: Positive for back pain. Skin: Negative for rash. Neurological: Negative for headaches, focal weakness or numbness.   ____________________________________________   PHYSICAL EXAM:  VITAL SIGNS: ED Triage Vitals  Enc Vitals Group      BP 04/13/18 2252 (!) 170/73     Pulse Rate 04/13/18 2252 90     Resp 04/13/18 2252 18     Temp 04/13/18 2252 98.4 F (36.9 C)     Temp Source 04/13/18 2252 Oral     SpO2 04/13/18 2252 100 %     Weight 04/13/18 2251 221 lb (100.2 kg)     Height 04/13/18 2251 5\' 2"  (1.575 m)     Head Circumference --      Peak Flow --      Pain Score 04/13/18 2251 10     Pain Loc --      Pain Edu? --      Excl. in GC? --     Constitutional: Alert and oriented x4 appears somewhat uncomfortable though nontoxic no diaphoresis and speaks in full clear sentences Eyes: PERRL EOMI. Head: Atraumatic. Nose: No congestion/rhinnorhea. Mouth/Throat: No trismus Neck: No stridor.   Cardiovascular: Normal rate, regular rhythm. Grossly normal heart sounds.  Good peripheral circulation. Respiratory: Normal respiratory effort.  No retractions. Lungs CTAB and moving good air Gastrointestinal: Soft nontender no peritonitis no costovertebral tenderness Musculoskeletal: No lower extremity edema quite tender right paraspinal lumbar Neurologic:  Normal speech and language. No gross focal neurologic deficits are appreciated. Skin:  Skin is warm, dry and intact. No rash noted. Psychiatric: Mood and affect are normal. Speech and behavior are normal.    ____________________________________________   DIFFERENTIAL includes but not limited to  Nephrolithiasis, pyelonephritis, muscle strain from appendicitis ____________________________________________   LABS (all labs ordered are listed, but only abnormal results are displayed)  Labs Reviewed  URINALYSIS, COMPLETE (UACMP) WITH MICROSCOPIC - Abnormal; Notable for the following components:      Result Value   Color, Urine YELLOW (*)    APPearance HAZY (*)    Bacteria, UA RARE (*)    All other components within normal limits  CBC - Abnormal; Notable for the following components:   WBC 13.8 (*)    All other components within normal limits  BASIC METABOLIC PANEL -  Abnormal; Notable for the following components:   Glucose, Bld 102 (*)    Creatinine, Ser 1.06 (*)    Calcium 8.2 (*)    All other components within normal limits  POCT PREGNANCY, URINE    Lab work reviewed by me with no clear etiology of the patient's symptoms identified __________________________________________  EKG   ____________________________________________  RADIOLOGY  CT stone protocol reviewed by me with no acute ureteral lithiasis ____________________________________________   PROCEDURES  Procedure(s) performed: no  Procedures  Critical Care performed: no  ____________________________________________   INITIAL IMPRESSION / ASSESSMENT AND PLAN / ED COURSE  Pertinent labs & imaging results that were available during my care of the patient were reviewed by me and considered in my medical decision making (see chart for details).   As part of my medical decision making, I reviewed the  following data within the electronic MEDICAL RECORD NUMBER History obtained from family if available, nursing notes, old chart and ekg, as well as notes from prior ED visits.  The patient comes to the emergency department with sudden onset pain in her right flank.  Given 100 mcg of IV fentanyl along with 15 mg of IV Toradol and 2.5 mg of IV Haldol for pain and nausea.  Labs are reassuring but I will send her for CT scan to evaluate for stone.  Following the medications the patient is significantly itchy so given her 50 mg of IV Benadryl with resolution of her symptoms.  Her CT scan is negative for ureterolithiasis and she has no blood in her urine.  Her symptoms are most likely related to muscle strain.  I will give her several days off of work and prescribed Flexeril ibuprofen and Lidoderm patches for home.  Strict return precautions have been given.      ____________________________________________   FINAL CLINICAL IMPRESSION(S) / ED DIAGNOSES  Final diagnoses:  Strain of lumbar  region, initial encounter      NEW MEDICATIONS STARTED DURING THIS VISIT:  Discharge Medication List as of 04/14/2018  1:00 AM    START taking these medications   Details  cyclobenzaprine (FLEXERIL) 10 MG tablet Take 1 tablet (10 mg total) by mouth 3 (three) times daily as needed for muscle spasms., Starting Fri 04/14/2018, Print    ibuprofen (ADVIL,MOTRIN) 600 MG tablet Take 1 tablet (600 mg total) by mouth every 8 (eight) hours as needed., Starting Fri 04/14/2018, Print    lidocaine (LIDODERM) 5 % Place 1 patch onto the skin every 12 (twelve) hours. Remove & Discard patch within 12 hours or as directed by MD, Starting Fri 04/14/2018, Until Sat 04/14/2019, Print         Note:  This document was prepared using Dragon voice recognition software and may include unintentional dictation errors.    Merrily Brittle, MD 04/16/18 [phone]

## 2018-04-14 LAB — URINALYSIS, COMPLETE (UACMP) WITH MICROSCOPIC
BILIRUBIN URINE: NEGATIVE
GLUCOSE, UA: NEGATIVE mg/dL
HGB URINE DIPSTICK: NEGATIVE
Ketones, ur: NEGATIVE mg/dL
LEUKOCYTES UA: NEGATIVE
NITRITE: NEGATIVE
Protein, ur: NEGATIVE mg/dL
SPECIFIC GRAVITY, URINE: 1.029 (ref 1.005–1.030)
pH: 5 (ref 5.0–8.0)

## 2018-04-14 MED ORDER — CYCLOBENZAPRINE HCL 10 MG PO TABS: 10.0000 mg | ORAL_TABLET | Freq: Three times a day (TID) | ORAL | 0 refills | Status: DC | PRN

## 2018-04-14 MED ORDER — DIPHENHYDRAMINE HCL 50 MG/ML IJ SOLN
50.0000 mg | Freq: Once | INTRAMUSCULAR | Status: AC
  Administered 2018-04-14: 50 mg via INTRAVENOUS
  Filled 2018-04-14: qty 1

## 2018-04-14 MED ORDER — IBUPROFEN 600 MG PO TABS: 600.0000 mg | ORAL_TABLET | Freq: Three times a day (TID) | ORAL | 0 refills | Status: DC | PRN

## 2018-04-14 MED ORDER — LIDOCAINE 5 % EX PTCH: 1.0000 | MEDICATED_PATCH | Freq: Two times a day (BID) | CUTANEOUS | 0 refills | Status: AC

## 2018-04-14 NOTE — Discharge Instructions (Signed)
Fortunately today your CT scan showed you do not have a kidney stone.  Please take your pain medication as needed for severe symptoms and follow up with primary care for a recheck.  Return to the ED sooner for any concerns.  It was a pleasure to take care of you today, and thank you for coming to our emergency department.  If you have any questions or concerns before leaving please ask the nurse to grab me and I'm more than happy to go through your aftercare instructions again.  If you were prescribed any opioid pain medication today such as Norco, Vicodin, Percocet, morphine, hydrocodone, or oxycodone please make sure you do not drive when you are taking this medication as it can alter your ability to drive safely.  If you have any concerns once you are home that you are not improving or are in fact getting worse before you can make it to your follow-up appointment, please do not hesitate to call [phone] and come back for further evaluation.  Merrily BrittleNeil Vikram Tillett, MD  Results for orders placed or performed during the hospital encounter of 04/13/18  Urinalysis, Complete w Microscopic  Result Value Ref Range   Color, Urine YELLOW (A) YELLOW   APPearance HAZY (A) CLEAR   Specific Gravity, Urine 1.029 1.005 - 1.030   pH 5.0 5.0 - 8.0   Glucose, UA NEGATIVE NEGATIVE mg/dL   Hgb urine dipstick NEGATIVE NEGATIVE   Bilirubin Urine NEGATIVE NEGATIVE   Ketones, ur NEGATIVE NEGATIVE mg/dL   Protein, ur NEGATIVE NEGATIVE mg/dL   Nitrite NEGATIVE NEGATIVE   Leukocytes, UA NEGATIVE NEGATIVE   RBC / HPF 0-5 0 - 5 RBC/hpf   WBC, UA 6-10 0 - 5 WBC/hpf   Bacteria, UA RARE (A) NONE SEEN   Squamous Epithelial / LPF 6-10 0 - 5   Mucus PRESENT   CBC  Result Value Ref Range   WBC 13.8 (H) 4.0 - 10.5 K/uL   RBC 4.50 3.87 - 5.11 MIL/uL   Hemoglobin 13.4 12.0 - 15.0 g/dL   HCT 16.141.3 09.636.0 - 04.546.0 %   MCV 91.8 80.0 - 100.0 fL   MCH 29.8 26.0 - 34.0 pg   MCHC 32.4 30.0 - 36.0 g/dL   RDW 40.913.4 81.111.5 - 91.415.5 %   Platelets  312 150 - 400 K/uL   nRBC 0.0 0.0 - 0.2 %  Basic metabolic panel  Result Value Ref Range   Sodium 138 135 - 145 mmol/L   Potassium 4.3 3.5 - 5.1 mmol/L   Chloride 106 98 - 111 mmol/L   CO2 27 22 - 32 mmol/L   Glucose, Bld 102 (H) 70 - 99 mg/dL   BUN 18 6 - 20 mg/dL   Creatinine, Ser 7.821.06 (H) 0.44 - 1.00 mg/dL   Calcium 8.2 (L) 8.9 - 10.3 mg/dL   GFR calc non Af Amer >60 >60 mL/min   GFR calc Af Amer >60 >60 mL/min   Anion gap 5 5 - 15  Pregnancy, urine POC  Result Value Ref Range   Preg Test, Ur NEGATIVE NEGATIVE   Ct Renal Stone Study  Result Date: 04/14/2018 CLINICAL DATA:  Initial evaluation for acute right flank pain, history of kidney stones. EXAM: CT ABDOMEN AND PELVIS WITHOUT CONTRAST TECHNIQUE: Multidetector CT imaging of the abdomen and pelvis was performed following the standard protocol without IV contrast. COMPARISON:  Prior CT from 07/20/2016. FINDINGS: Lower chest: Visualized lung bases are clear. Hepatobiliary: Limited noncontrast evaluation of the liver is unremarkable.  Gallbladder within normal limits. No biliary dilatation. Pancreas: Pancreas within normal limits. Spleen: Unremarkable. Adrenals/Urinary Tract: Adrenal glands within normal limits. Right kidney within normal limits without nephrolithiasis or hydronephrosis. No radiopaque calculi seen along the course of the right renal collecting system. No hydroureter. On the left, there is a 5 mm nonobstructive stone present within the lower pole. No other radiopaque calculi seen along the course of the left renal collecting system. No left-sided hydronephrosis or hydroureter. Bladder largely decompressed without acute abnormality. No layering stones within the bladder lumen. Stomach/Bowel: Stomach within normal limits. No evidence for bowel obstruction. No acute inflammatory changes seen about the bowels. Vascular/Lymphatic: Intra- abdominal aorta of normal caliber. No enlarged adenopathy within the abdomen. Mildly prominent  bilateral inguinal lymph nodes measure up to 17 mm on the right and 15 mm on the left. No other adenopathy within the pelvis. Reproductive: Uterus within normal limits. Right ovary unremarkable. 2.3 cm left ovarian cyst, most likely a normal physiologic follicular cyst. Other: No free air or fluid. Musculoskeletal: No acute osseus abnormality. No discrete lytic or blastic osseous lesions. IMPRESSION: 1. 5 mm nonobstructive left renal nephrolithiasis. No evidence for obstructive uropathy. No right-sided calculi to explain right-sided flank pain. 2. Mildly enlarged bilateral inguinal lymph nodes, of uncertain etiology or significance, but may be reactive. 3. No other acute intra-abdominal or pelvic process. Electronically Signed   By: Rise Mu M.D.   On: 04/14/2018 00:16

## 2020-01-02 ENCOUNTER — Ambulatory Visit (INDEPENDENT_AMBULATORY_CARE_PROVIDER_SITE_OTHER): Payer: Medicaid Other

## 2020-01-02 ENCOUNTER — Ambulatory Visit
Admission: EM | Admit: 2020-01-02 | Discharge: 2020-01-02 | Disposition: A | Discharge status: Home / Self Care | Payer: Medicaid Other | Attending: Emergency Medicine | Admitting: Emergency Medicine

## 2020-01-02 ENCOUNTER — Other Ambulatory Visit: Payer: Self-pay

## 2020-01-02 DIAGNOSIS — M5387 Other specified dorsopathies, lumbosacral region: Secondary | ICD-10-CM

## 2020-01-02 DIAGNOSIS — M545 Low back pain, unspecified: Secondary | ICD-10-CM

## 2020-01-02 MED ORDER — HYDROCODONE-ACETAMINOPHEN 5-325 MG PO TABS: 2.0000 | ORAL_TABLET | ORAL | 0 refills | Status: DC | PRN

## 2020-01-02 MED ORDER — PREDNISONE 10 MG (21) PO TBPK: ORAL_TABLET | Freq: Every day | ORAL | 0 refills | Status: DC

## 2020-01-02 NOTE — ED Provider Notes (Signed)
[facility]    CSN: [id number] Arrival date & time: 01/02/20  1446      History   Chief Complaint Chief Complaint  Patient presents with  . Sciatica    HPI Kaylee Mcguire is a 44 y.o. female.   44 yo female here for evaluation of low back pain that radiates through her right buttock and down the outside of her right leg to her calf. The pain started 5 days ago. She was evaluated at [facility] 3 days ago and diagnosed with low back pan and sciatica and given scripts for Naproxen and Flexeril which have not been helping. There was no imaging of her back at that time. Patient denies trauma or falls. She also denies numbness, tingling, weakness, or incontinence.      Past Medical History:  Diagnosis Date  . ADHD (attention deficit hyperactivity disorder)   . Eczema   . IBS (irritable bowel syndrome)   . PTSD (post-traumatic stress disorder)     There are no problems to display for this patient.   History reviewed. No pertinent surgical history.  OB History   No obstetric history on file.      Home Medications    Prior to Admission medications   Medication Sig Start Date End Date Taking? Authorizing Provider  cyclobenzaprine (FLEXERIL) 10 MG tablet Take 1 tablet (10 mg total) by mouth 3 (three) times daily as needed for muscle spasms. 04/14/18  Yes Merrily Brittle, MD  naproxen (NAPROSYN) 500 MG tablet Take 1 tablet (500 mg total) by mouth 2 (two) times daily with a meal. 10/04/16  Yes Joni Reining, PA-C  HYDROcodone-acetaminophen (NORCO/VICODIN) 5-325 MG tablet Take 2 tablets by mouth every 4 (four) hours as needed. 01/02/20   Becky Augusta, NP  predniSONE (STERAPRED UNI-PAK 21 TAB) 10 MG (21) TBPK tablet Take by mouth daily. Take 6 tabs by mouth daily  for 2 days, then 5 tabs for 2 days, then 4 tabs for 2 days, then 3 tabs for 2 days, 2 tabs for 2 days, then 1 tab by mouth daily for 2 days 01/02/20   Becky Augusta, NP  cetirizine (ZYRTEC) 10 MG tablet  Take 10 mg by mouth daily.  01/02/20  [provider]  lisinopril (PRINIVIL,ZESTRIL) 10 MG tablet Take 10 mg by mouth daily.  01/02/20  [provider]  sertraline (ZOLOFT) 50 MG tablet Take 50 mg by mouth daily.  01/02/20  [provider]  traZODone (DESYREL) 100 MG tablet Take 100 mg by mouth at bedtime.  01/02/20  [provider]    Family History Family History  Problem Relation Age of Onset  . Heart disease Father   . Bladder Cancer Neg Hx   . Kidney cancer Neg Hx     Social History Social History   Tobacco Use  . Smoking status: Current Every Day Smoker    Packs/day: 0.50    Types: Cigarettes  . Smokeless tobacco: Never Used  Vaping Use  . Vaping Use: Never used  Substance Use Topics  . Alcohol use: No  . Drug use: No     Allergies   Codeine   Review of Systems Review of Systems  Constitutional: Negative for activity change, appetite change, chills and fever.  HENT: Negative for congestion and rhinorrhea.   Respiratory: Negative for cough and shortness of breath.   Cardiovascular: Negative for chest pain.  Gastrointestinal: Negative for nausea and vomiting.  Genitourinary: Negative for difficulty urinating and frequency.  Musculoskeletal: Positive for back pain and gait problem. Negative for myalgias.  Skin: Positive for rash.       Patient has eczema patches all over her body.   Neurological: Negative for dizziness, weakness and numbness.  Hematological: Negative.   Psychiatric/Behavioral: Negative.      Physical Exam Triage Vital Signs ED Triage Vitals  Enc Vitals Group     BP 01/02/20 1513 (!) 139/99     Pulse Rate 01/02/20 1513 93     Resp 01/02/20 1513 18     Temp 01/02/20 1513 99.1 F (37.3 C)     Temp Source 01/02/20 1513 Oral     SpO2 01/02/20 1513 100 %     Weight 01/02/20 1513 212 lb (96.2 kg)     Height 01/02/20 1513 5\' 3"  (1.6 m)     Head Circumference --      Peak Flow --      Pain Score 01/02/20  1512 10     Pain Loc --      Pain Edu? --      Excl. in GC? --    No data found.  Updated Vital Signs BP (!) 139/99 (BP Location: Left Arm)   Pulse 93   Temp 99.1 F (37.3 C) (Oral)   Resp 18   Ht 5\' 3"  (1.6 m)   Wt 212 lb (96.2 kg)   LMP 12/19/2019   SpO2 100%   BMI 37.55 kg/m   Visual Acuity Right Eye Distance:   Left Eye Distance:   Bilateral Distance:    Right Eye Near:   Left Eye Near:    Bilateral Near:     Physical Exam Vitals and nursing note reviewed.  Constitutional:      General: She is in acute distress.     Appearance: Normal appearance.     Comments: Patient appears in pain.   HENT:     Head: Normocephalic and atraumatic.  Eyes:     General: No scleral icterus.    Extraocular Movements: Extraocular movements intact.     Conjunctiva/sclera: Conjunctivae normal.     Pupils: Pupils are equal, round, and reactive to light.  Cardiovascular:     Rate and Rhythm: Normal rate and regular rhythm.     Pulses: Normal pulses.     Heart sounds: Normal heart sounds. No murmur heard.  No gallop.   Pulmonary:     Effort: Pulmonary effort is normal. No respiratory distress.     Breath sounds: Normal breath sounds. No wheezing, rhonchi or rales.  Musculoskeletal:        General: Tenderness present. No swelling or deformity.     Cervical back: Normal range of motion and neck supple.     Right lower leg: No edema.     Left lower leg: No edema.     Comments: Patient cannot sit on her right buttock and there is pain diagonally from her low back to her right greater trochanter. Positive straight leg raise on the right as well. Patient walking with a limp.  No midline lumbar spine tenderness. Marked right Paraspinous tenderness at level of L5-S1.   Lymphadenopathy:     Cervical: No cervical adenopathy.  Skin:    General: Skin is warm and dry.     Capillary Refill: Capillary refill takes less than 2 seconds.     Findings: Rash present. No erythema.     Comments:  Eczema patches all over extremities.   Neurological:     General:  No focal deficit present.     Mental Status: She is alert and oriented to person, place, and time.     Sensory: No sensory deficit.     Motor: No weakness.  Psychiatric:        Mood and Affect: Mood normal.        Behavior: Behavior normal.        Thought Content: Thought content normal.        Judgment: Judgment normal.      UC Treatments / Results  Labs (all labs ordered are listed, but only abnormal results are displayed) Labs Reviewed - No data to display  EKG   Radiology DG Lumbar Spine 2-3 Views  Result Date: 01/02/2020 CLINICAL DATA:  44 year old female with acute low back pain radiating down her right leg. EXAM: LUMBAR SPINE - 2-3 VIEW COMPARISON:  CT Abdomen and Pelvis 04/13/2018. FINDINGS: Normal lumbar segmentation. Mild levoconvex lumbar scoliosis with stable lumbar lordosis from last year. No acute osseous abnormality identified. Chronic disc and/or endplate degeneration L1-L2, L2-L3 and L5-S1. Visible sacrum and SI joints appear intact. Negative visible abdominal and pelvic visceral contours. IMPRESSION: 1. No acute osseous abnormality identified in the lumbar spine. 2. Mild levoconvex lumbar scoliosis with chronic disc and endplate degeneration L1-L2, L2-L3 and L5-S1. Electronically Signed   By: Odessa Fleming M.D.   On: 01/02/2020 16:17    Procedures Procedures (including critical care time)  Medications Ordered in UC Medications - No data to display  Initial Impression / Assessment and Plan / UC Course  I have reviewed the triage vital signs and the nursing notes.  Pertinent labs & imaging results that were available during my care of the patient were reviewed by me and considered in my medical decision making (see chart for details).   Patient is here for re-evaluation of her low back pain and pain relief. She has been using Naproxen and flexeril for 3 days without relief and has been out of work as  well. She has no history of trauma or sciatica. She denies injury. PE indicated sciatica.   Will obtain LS films and D/C with Rx for Prednisone and tramadol for bedtime.    Lumbar spine films show L5-S1 disc degeneration and malalignment of the vertebra. Will await radiology over read.   Radiology reading levoconvex lumbar scolosis.  Will D/C home with Prednisone and tramadol  Final Clinical Impressions(s) / UC Diagnoses   Final diagnoses:  Sciatica associated with disorder of lumbosacral spine     Discharge Instructions     Take the Prednisone as directed by the package insert. Use the Norco as needed for severe pain. Follow the back exercises given at discharge.  If your symptoms continue follow up with orthopedics.     ED Prescriptions    Medication Sig Dispense Auth. Provider   predniSONE (STERAPRED UNI-PAK 21 TAB) 10 MG (21) TBPK tablet Take by mouth daily. Take 6 tabs by mouth daily  for 2 days, then 5 tabs for 2 days, then 4 tabs for 2 days, then 3 tabs for 2 days, 2 tabs for 2 days, then 1 tab by mouth daily for 2 days 42 tablet Becky Augusta, NP   HYDROcodone-acetaminophen (NORCO/VICODIN) 5-325 MG tablet Take 2 tablets by mouth every 4 (four) hours as needed. 6 tablet Becky Augusta, NP     I have reviewed the PDMP during this encounter.   Becky Augusta, NP 01/02/20 1655

## 2020-01-02 NOTE — Discharge Instructions (Addendum)
Take the Prednisone as directed by the package insert. Use the Norco as needed for severe pain. Follow the back exercises given at discharge.  If your symptoms continue follow up with orthopedics.

## 2020-01-02 NOTE — ED Triage Notes (Signed)
Patient states that she has been having sciatica since Saturday. States that she went to [facility] [place] earlier this week and was prescribed flexeril and naproxen without relief. Patient states that pain radiates down her right leg and she cannot walk properly.

## 2020-01-16 DIAGNOSIS — M4186 Other forms of scoliosis, lumbar region: Secondary | ICD-10-CM | POA: Insufficient documentation

## 2022-12-14 DIAGNOSIS — M545 Low back pain, unspecified: Secondary | ICD-10-CM | POA: Diagnosis not present

## 2022-12-14 DIAGNOSIS — M25552 Pain in left hip: Secondary | ICD-10-CM | POA: Diagnosis not present

## 2022-12-14 DIAGNOSIS — Z791 Long term (current) use of non-steroidal anti-inflammatories (NSAID): Secondary | ICD-10-CM | POA: Diagnosis not present

## 2022-12-14 DIAGNOSIS — M9904 Segmental and somatic dysfunction of sacral region: Secondary | ICD-10-CM | POA: Diagnosis not present

## 2022-12-14 DIAGNOSIS — M47817 Spondylosis without myelopathy or radiculopathy, lumbosacral region: Secondary | ICD-10-CM | POA: Diagnosis not present

## 2022-12-14 DIAGNOSIS — M25559 Pain in unspecified hip: Secondary | ICD-10-CM | POA: Diagnosis not present

## 2022-12-14 DIAGNOSIS — M5137 Other intervertebral disc degeneration, lumbosacral region: Secondary | ICD-10-CM | POA: Diagnosis not present

## 2022-12-14 DIAGNOSIS — M25551 Pain in right hip: Secondary | ICD-10-CM | POA: Diagnosis not present

## 2022-12-14 DIAGNOSIS — M4726 Other spondylosis with radiculopathy, lumbar region: Secondary | ICD-10-CM | POA: Diagnosis not present

## 2022-12-14 DIAGNOSIS — M5116 Intervertebral disc disorders with radiculopathy, lumbar region: Secondary | ICD-10-CM | POA: Diagnosis not present
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
## 2023-01-27 ENCOUNTER — Encounter: Payer: Self-pay | Admitting: Physician Assistant

## 2023-01-27 ENCOUNTER — Ambulatory Visit: Payer: Medicaid Other | Admitting: Physician Assistant

## 2023-01-27 VITALS — BP 135/95 | HR 93 | Temp 98.7°F | Resp 20 | Ht 63.0 in | Wt 243.3 lb

## 2023-01-27 DIAGNOSIS — F908 Attention-deficit hyperactivity disorder, other type: Secondary | ICD-10-CM

## 2023-01-27 DIAGNOSIS — Z7689 Persons encountering health services in other specified circumstances: Secondary | ICD-10-CM

## 2023-01-27 DIAGNOSIS — F32A Depression, unspecified: Secondary | ICD-10-CM | POA: Diagnosis not present

## 2023-01-27 DIAGNOSIS — R600 Localized edema: Secondary | ICD-10-CM | POA: Diagnosis not present

## 2023-01-27 DIAGNOSIS — K588 Other irritable bowel syndrome: Secondary | ICD-10-CM | POA: Diagnosis not present

## 2023-01-27 DIAGNOSIS — G8929 Other chronic pain: Secondary | ICD-10-CM | POA: Diagnosis not present

## 2023-01-27 DIAGNOSIS — F4310 Post-traumatic stress disorder, unspecified: Secondary | ICD-10-CM

## 2023-01-27 DIAGNOSIS — L308 Other specified dermatitis: Secondary | ICD-10-CM | POA: Diagnosis not present

## 2023-01-27 DIAGNOSIS — M5441 Lumbago with sciatica, right side: Secondary | ICD-10-CM | POA: Diagnosis not present

## 2023-01-27 DIAGNOSIS — F419 Anxiety disorder, unspecified: Secondary | ICD-10-CM

## 2023-01-27 DIAGNOSIS — F17200 Nicotine dependence, unspecified, uncomplicated: Secondary | ICD-10-CM

## 2023-01-28 ENCOUNTER — Encounter: Payer: Self-pay | Admitting: Physician Assistant

## 2023-01-28 DIAGNOSIS — F908 Attention-deficit hyperactivity disorder, other type: Secondary | ICD-10-CM | POA: Insufficient documentation

## 2023-01-28 DIAGNOSIS — L309 Dermatitis, unspecified: Secondary | ICD-10-CM | POA: Insufficient documentation

## 2023-01-28 DIAGNOSIS — F419 Anxiety disorder, unspecified: Secondary | ICD-10-CM | POA: Insufficient documentation

## 2023-01-28 DIAGNOSIS — F4310 Post-traumatic stress disorder, unspecified: Secondary | ICD-10-CM | POA: Insufficient documentation

## 2023-01-28 DIAGNOSIS — G8929 Other chronic pain: Secondary | ICD-10-CM | POA: Insufficient documentation

## 2023-01-28 DIAGNOSIS — IMO0001 Reserved for inherently not codable concepts without codable children: Secondary | ICD-10-CM | POA: Insufficient documentation

## 2023-01-28 DIAGNOSIS — R600 Localized edema: Secondary | ICD-10-CM | POA: Insufficient documentation

## 2023-01-28 DIAGNOSIS — F17200 Nicotine dependence, unspecified, uncomplicated: Secondary | ICD-10-CM | POA: Insufficient documentation

## 2023-01-28 DIAGNOSIS — K588 Other irritable bowel syndrome: Secondary | ICD-10-CM | POA: Insufficient documentation

## 2023-01-28 LAB — COMPREHENSIVE METABOLIC PANEL
ALT: 21 [IU]/L (ref 0–32)
AST: 20 [IU]/L (ref 0–40)
Albumin: 3.4 g/dL — ABNORMAL LOW (ref 3.9–4.9)
Alkaline Phosphatase: 62 [IU]/L (ref 44–121)
BUN/Creatinine Ratio: 16 (ref 9–23)
BUN: 17 mg/dL (ref 6–24)
Bilirubin Total: <0.2 mg/dL (ref 0.0–1.2)
CO2: 22 mmol/L (ref 20–29)
Calcium: 8.4 mg/dL — ABNORMAL LOW (ref 8.7–10.2)
Chloride: 106 mmol/L (ref 96–106)
Creatinine, Ser: 1.06 mg/dL — ABNORMAL HIGH (ref 0.57–1.00)
Globulin, Total: 1.5 g/dL (ref 1.5–4.5)
Glucose: 108 mg/dL — ABNORMAL HIGH (ref 70–99)
Potassium: 4.5 mmol/L (ref 3.5–5.2)
Sodium: 140 mmol/L (ref 134–144)
Total Protein: 4.9 g/dL — ABNORMAL LOW (ref 6.0–8.5)
eGFR: 66 mL/min/{1.73_m2} (ref >59)

## 2023-01-28 LAB — HEMOGLOBIN A1C
Est. average glucose Bld gHb Est-mCnc: 111 mg/dL
Hgb A1c MFr Bld: 5.5 % (ref 4.8–5.6)

## 2023-01-28 LAB — LIPID PANEL
Chol/HDL Ratio: 3.3 ratio (ref 0.0–4.4)
Cholesterol, Total: 137 mg/dL (ref 100–199)
HDL: 41 mg/dL (ref >39)
LDL Chol Calc (NIH): 70 mg/dL (ref 0–99)
Triglycerides: 152 mg/dL — ABNORMAL HIGH (ref 0–149)
VLDL Cholesterol Cal: 26 mg/dL (ref 5–40)

## 2023-01-28 LAB — CBC WITH DIFFERENTIAL/PLATELET
Basophils Absolute: 0.1 10*3/uL (ref 0.0–0.2)
Basos: 0 %
EOS (ABSOLUTE): 0.3 10*3/uL (ref 0.0–0.4)
Eos: 2 %
Hematocrit: 44.2 % (ref 34.0–46.6)
Hemoglobin: 14.4 g/dL (ref 11.1–15.9)
Immature Grans (Abs): 0 10*3/uL (ref 0.0–0.1)
Immature Granulocytes: 0 %
Lymphocytes Absolute: 2.3 10*3/uL (ref 0.7–3.1)
Lymphs: 21 %
MCH: 30.3 pg (ref 26.6–33.0)
MCHC: 32.6 g/dL (ref 31.5–35.7)
MCV: 93 fL (ref 79–97)
Monocytes Absolute: 0.6 10*3/uL (ref 0.1–0.9)
Monocytes: 6 %
Neutrophils Absolute: 7.8 10*3/uL — ABNORMAL HIGH (ref 1.4–7.0)
Neutrophils: 71 %
Platelets: 350 10*3/uL (ref 150–450)
RBC: 4.75 x10E6/uL (ref 3.77–5.28)
RDW: 12.5 % (ref 11.7–15.4)
WBC: 11.1 10*3/uL — ABNORMAL HIGH (ref 3.4–10.8)

## 2023-01-28 LAB — TSH: TSH: 1.49 u[IU]/mL (ref 0.450–4.500)

## 2023-02-24 ENCOUNTER — Encounter: Payer: Self-pay | Admitting: Physician Assistant

## 2023-02-24 ENCOUNTER — Ambulatory Visit: Payer: Medicaid Other | Admitting: Physician Assistant

## 2023-02-24 VITALS — BP 151/83 | HR 72 | Ht 63.0 in | Wt 244.0 lb

## 2023-02-24 DIAGNOSIS — I10 Essential (primary) hypertension: Secondary | ICD-10-CM

## 2023-02-24 DIAGNOSIS — L308 Other specified dermatitis: Secondary | ICD-10-CM

## 2023-02-24 DIAGNOSIS — R030 Elevated blood-pressure reading, without diagnosis of hypertension: Secondary | ICD-10-CM

## 2023-02-24 DIAGNOSIS — F419 Anxiety disorder, unspecified: Secondary | ICD-10-CM | POA: Diagnosis not present

## 2023-02-24 DIAGNOSIS — F17200 Nicotine dependence, unspecified, uncomplicated: Secondary | ICD-10-CM | POA: Diagnosis not present

## 2023-02-24 DIAGNOSIS — IMO0001 Reserved for inherently not codable concepts without codable children: Secondary | ICD-10-CM

## 2023-02-24 DIAGNOSIS — F32A Depression, unspecified: Secondary | ICD-10-CM

## 2023-02-24 MED ORDER — TRIAMCINOLONE ACETONIDE 0.1 % EX CREA: 1.0000 | TOPICAL_CREAM | Freq: Two times a day (BID) | CUTANEOUS | 0 refills | Status: AC

## 2023-02-24 MED ORDER — CETIRIZINE HCL 10 MG PO TABS: 10.0000 mg | ORAL_TABLET | Freq: Every day | ORAL | 11 refills | Status: DC

## 2023-02-24 MED ORDER — LISINOPRIL 5 MG PO TABS: 5.0000 mg | ORAL_TABLET | Freq: Every day | ORAL | 3 refills | Status: DC

## 2023-02-24 MED ORDER — PREDNISONE 20 MG PO TABS: 20.0000 mg | ORAL_TABLET | Freq: Every day | ORAL | 0 refills | Status: DC

## 2023-02-24 NOTE — Progress Notes (Signed)
Established patient visit  Patient: Kaylee Mcguire   DOB: 1975-10-20   47 y.o. Female  MRN: [id number] Visit Date: 02/24/2023  Today's healthcare provider: Debera Lat, PA-C   Chief Complaint  Patient presents with   Eczema   Subjective    HPI HPI   Follow-up Pt stated-whole body have eczema flair-up-redness, itching--2 weeks. Last edited by Shelly Bombard, CMA on 02/24/2023  3:08 PM.      Discussed the use of AI scribe software for clinical note transcription with the patient, who gave verbal consent to proceed.  History of Present Illness   The patient presents with a severe skin rash that they attribute to the cold weather and their extremely dry skin. They have a history of eczema and have been dealing with this condition all their life. The rash is itchy and burns, and they have been unable to see a dermatologist until February. They are scheduled for allergy testing next month. They have not been taking any antihistamines or topical steroids for the rash. They request a round of prednisone, which they believe will help.  The patient is currently under significant stress due to financial difficulties and a pending eviction. They are the only one working in their household as their fiance has health issues. They have been smoking due to stress. They also report short-term memory loss and have gained significant weight since their father committed suicide. They have a history of irritable bowel syndrome, which is currently under control, and leg sores. They also report having a brain dysfunction diagnosed at age 54.  The patient's blood pressure is elevated, and they have a history of taking lisinopril. They also report double vision and blurred vision since a recent fall where they hit their head. They have a history of concussions.           02/24/2023    3:08 PM 01/27/2023    2:54 PM  Depression screen PHQ 2/9  Decreased Interest 1 2  Down, Depressed, Hopeless 3 2  PHQ - 2  Score 4 4  Altered sleeping 1 0  Tired, decreased energy 2 3  Change in appetite 0 1  Feeling bad or failure about yourself  1 2  Trouble concentrating 3 3  Moving slowly or fidgety/restless 1 0  Suicidal thoughts 0 0  PHQ-9 Score 12 13  Difficult doing work/chores Somewhat difficult Somewhat difficult      02/24/2023    3:08 PM  GAD 7 : Generalized Anxiety Score  Nervous, Anxious, on Edge 2  Control/stop worrying 2  Worry too much - different things 2  Trouble relaxing 2  Restless 2  Easily annoyed or irritable 2  Afraid - awful might happen 2  Total GAD 7 Score 14  Anxiety Difficulty Not difficult at all    Medications: Outpatient Medications Prior to Visit  Medication Sig   meloxicam (MOBIC) 15 MG tablet Take 15 mg by mouth daily.   methocarbamol (ROBAXIN) 500 MG tablet Take 500 mg by mouth 4 (four) times daily.   naproxen (NAPROSYN) 500 MG tablet Take 1 tablet (500 mg total) by mouth 2 (two) times daily with a meal.   predniSONE (STERAPRED UNI-PAK 21 TAB) 10 MG (21) TBPK tablet Take by mouth daily. Take 6 tabs by mouth daily  for 2 days, then 5 tabs for 2 days, then 4 tabs for 2 days, then 3 tabs for 2 days, 2 tabs for 2 days, then 1 tab by mouth daily for 2  days (Patient not taking: Reported on 01/27/2023)   [DISCONTINUED] cyclobenzaprine (FLEXERIL) 10 MG tablet Take 1 tablet (10 mg total) by mouth 3 (three) times daily as needed for muscle spasms. (Patient not taking: Reported on 01/27/2023)   [DISCONTINUED] HYDROcodone-acetaminophen (NORCO/VICODIN) 5-325 MG tablet Take 2 tablets by mouth every 4 (four) hours as needed. (Patient not taking: Reported on 01/27/2023)   No facility-administered medications prior to visit.    Review of Systems  All other systems reviewed and are negative.  Except see HPI       Objective    BP (!) 151/83 (BP Location: Right Arm, Patient Position: Sitting, Cuff Size: Large)   Pulse 72   Ht 5\' 3"  (1.6 m)   Wt 244 lb (110.7 kg)   SpO2 97%    BMI 43.22 kg/m     Physical Exam Vitals reviewed.  Constitutional:      General: She is not in acute distress.    Appearance: Normal appearance. She is well-developed. She is not diaphoretic.  HENT:     Head: Normocephalic and atraumatic.  Eyes:     General: No scleral icterus.    Conjunctiva/sclera: Conjunctivae normal.  Neck:     Thyroid: No thyromegaly.  Cardiovascular:     Rate and Rhythm: Normal rate and regular rhythm.     Pulses: Normal pulses.     Heart sounds: Normal heart sounds. No murmur heard. Pulmonary:     Effort: Pulmonary effort is normal. No respiratory distress.     Breath sounds: Normal breath sounds. No wheezing, rhonchi or rales.  Musculoskeletal:     Cervical back: Neck supple.     Right lower leg: No edema.     Left lower leg: No edema.  Lymphadenopathy:     Cervical: No cervical adenopathy.  Skin:    General: Skin is warm and dry.     Findings: Rash present.  Neurological:     Mental Status: She is alert and oriented to person, place, and time. Mental status is at baseline.  Psychiatric:        Mood and Affect: Mood normal.        Behavior: Behavior normal.      No results found for any visits on 02/24/23.  Assessment & Plan    1. Elevated blood pressure reading - lisinopril (ZESTRIL) 5 MG tablet; Take 1 tablet (5 mg total) by mouth daily.  Dispense: 90 tablet; Refill: 3  2. Other eczema - cetirizine (ZYRTEC) 10 MG tablet; Take 1 tablet (10 mg total) by mouth daily.  Dispense: 30 tablet; Refill: 11 - triamcinolone cream (KENALOG) 0.1 %; Apply 1 Application topically 2 (two) times daily.  Dispense: 463 g; Refill: 0     Eczema Chronic condition exacerbated by stress and cold weather. Patient has a history of dry skin and is awaiting dermatology and allergy appointments. -Prescribe a 5-day course of Prednisone. -Prescribe Triamcinolone cream (large tube).  Hypertension Elevated blood pressure noted during visit. Patient is under  significant stress due to financial and housing issues. -Prescribe Lisinopril 5mg . -Advise patient to monitor blood pressure at home and start medication if readings consistently exceed 140.  Anxiety and Depression Patient reports significant stress and has not been contacted by behavioral health services. -Ensure referral to behavioral health services is in place.  Smoking Patient continues to smoke, likely as a coping mechanism for stress. -Encourage smoking cessation and discuss resources when available.  Obesity Weight Gain Patient reports significant weight gain following a traumatic  event. -Encourage diet and exercise. -Discuss potential weight loss medications at next visit if insurance covers.  Follow-up in 6 weeks to monitor blood pressure, response to eczema treatment, and overall health status.      Return in about 6 weeks (around 04/07/2023) for BP f/u, chronic disease f/u.     The patient was advised to call back or seek an in-person evaluation if the symptoms worsen or if the condition fails to improve as anticipated.  I discussed the assessment and treatment plan with the patient. The patient was provided an opportunity to ask questions and all were answered. The patient agreed with the plan and demonstrated an understanding of the instructions.  I, Debera Lat, PA-C have reviewed all documentation for this visit. The documentation on  02/24/23  for the exam, diagnosis, procedures, and orders are all accurate and complete.  Debera Lat, [facility], MMS [facility] [phone] (phone) [phone] (fax)  [facility]

## 2023-02-25 DIAGNOSIS — I10 Essential (primary) hypertension: Secondary | ICD-10-CM | POA: Insufficient documentation

## 2023-03-24 DIAGNOSIS — J3089 Other allergic rhinitis: Secondary | ICD-10-CM | POA: Diagnosis not present

## 2023-03-24 DIAGNOSIS — L309 Dermatitis, unspecified: Secondary | ICD-10-CM | POA: Diagnosis not present

## 2023-03-24 DIAGNOSIS — H1045 Other chronic allergic conjunctivitis: Secondary | ICD-10-CM | POA: Diagnosis not present

## 2023-03-24 DIAGNOSIS — F17210 Nicotine dependence, cigarettes, uncomplicated: Secondary | ICD-10-CM | POA: Diagnosis not present

## 2023-04-05 DIAGNOSIS — M5416 Radiculopathy, lumbar region: Secondary | ICD-10-CM | POA: Diagnosis not present

## 2023-04-05 DIAGNOSIS — M47816 Spondylosis without myelopathy or radiculopathy, lumbar region: Secondary | ICD-10-CM | POA: Diagnosis not present

## 2023-04-05 DIAGNOSIS — M5136 Other intervertebral disc degeneration, lumbar region with discogenic back pain only: Secondary | ICD-10-CM | POA: Diagnosis not present

## 2023-04-07 ENCOUNTER — Ambulatory Visit: Payer: Medicaid Other | Admitting: Physician Assistant

## 2023-04-07 VITALS — BP 126/83 | HR 88 | Resp 16 | Ht 63.0 in | Wt 241.8 lb

## 2023-04-07 DIAGNOSIS — F32A Depression, unspecified: Secondary | ICD-10-CM | POA: Diagnosis not present

## 2023-04-07 DIAGNOSIS — E781 Pure hyperglyceridemia: Secondary | ICD-10-CM

## 2023-04-07 DIAGNOSIS — I10 Essential (primary) hypertension: Secondary | ICD-10-CM

## 2023-04-07 DIAGNOSIS — J302 Other seasonal allergic rhinitis: Secondary | ICD-10-CM | POA: Diagnosis not present

## 2023-04-07 DIAGNOSIS — J018 Other acute sinusitis: Secondary | ICD-10-CM

## 2023-04-07 DIAGNOSIS — G4489 Other headache syndrome: Secondary | ICD-10-CM | POA: Diagnosis not present

## 2023-04-07 DIAGNOSIS — F419 Anxiety disorder, unspecified: Secondary | ICD-10-CM | POA: Diagnosis not present

## 2023-04-07 DIAGNOSIS — F17200 Nicotine dependence, unspecified, uncomplicated: Secondary | ICD-10-CM | POA: Diagnosis not present

## 2023-04-07 DIAGNOSIS — L308 Other specified dermatitis: Secondary | ICD-10-CM

## 2023-04-07 NOTE — Progress Notes (Signed)
Established patient visit  Patient: Kaylee Mcguire   DOB: 12/05/1975   48 y.o. Female  MRN: [id number] Visit Date: 04/07/2023  Today's healthcare provider: Debera Lat, PA-C   Chief Complaint  Patient presents with   Medical Management of Chronic Issues   Subjective       Discussed the use of AI scribe software for clinical note transcription with the patient, who gave verbal consent to proceed.  History of Present Illness   The patient, with a history of skin rash, anxiety, depression, and weight gain, presents with a recent fall resulting in a head injury and subsequent headaches. The patient reports that the rash has improved with the use of a prescribed cream and prednisone. The patient also reports that they have not yet scheduled physical therapy, which was previously discussed.  The patient reports a recent increase in headaches, which they attribute to a fall at a skating rink on November 24th. The headaches occur 1-3 times a week and are managed with Tylenol. The patient also reports blurry vision and double vision, which they manage with reading glasses.  The patient also reports sinus issues and a persistent cough, which they believe may be related to their headaches. The patient reports that they are constantly thirsty, which they believe may be a side effect of their medication.  The patient also reports weight fluctuation and a lack of appetite. They report that they eat at home and do not eat out often. The patient acknowledges the need for weight loss and expresses interest in a nutritionist program.           04/07/2023    3:13 PM 02/24/2023    3:08 PM 01/27/2023    2:54 PM  Depression screen PHQ 2/9  Decreased Interest 1 1 2   Down, Depressed, Hopeless 1 3 2   PHQ - 2 Score 2 4 4   Altered sleeping 0 1 0  Tired, decreased energy 1 2 3   Change in appetite 2 0 1  Feeling bad or failure about yourself  1 1 2   Trouble concentrating 2 3 3   Moving slowly or  fidgety/restless 1 1 0  Suicidal thoughts 0 0 0  PHQ-9 Score 9 12 13   Difficult doing work/chores Somewhat difficult Somewhat difficult Somewhat difficult      04/07/2023    3:14 PM 02/24/2023    3:08 PM  GAD 7 : Generalized Anxiety Score  Nervous, Anxious, on Edge 2 2  Control/stop worrying 1 2  Worry too much - different things 2 2  Trouble relaxing 2 2  Restless 2 2  Easily annoyed or irritable 2 2  Afraid - awful might happen 1 2  Total GAD 7 Score 12 14  Anxiety Difficulty Somewhat difficult Not difficult at all    Medications: Outpatient Medications Prior to Visit  Medication Sig   acetaminophen (TYLENOL) 500 MG tablet Take 500 mg by mouth 3 (three) times daily.   cetirizine (ZYRTEC) 10 MG tablet Take 1 tablet (10 mg total) by mouth daily.   diclofenac (VOLTAREN) 75 MG EC tablet Take 75 mg by mouth 2 (two) times daily as needed (pain).   EPINEPHrine 0.3 mg/0.3 mL IJ SOAJ injection as directed Injection prn for systemic reactions for 30 days   fluticasone (FLONASE) 50 MCG/ACT nasal spray Place 1 spray into both nostrils daily.   lisinopril (ZESTRIL) 5 MG tablet Take 1 tablet (5 mg total) by mouth daily.   methocarbamol (ROBAXIN) 500 MG tablet Take 500 mg by  mouth 4 (four) times daily.   Olopatadine HCl 0.2 % SOLN 1 drop into affected eye Ophthalmic Once a day or prn for 30 days   triamcinolone cream (KENALOG) 0.1 % Apply 1 Application topically 2 (two) times daily.   meloxicam (MOBIC) 15 MG tablet Take 15 mg by mouth daily.   naproxen (NAPROSYN) 500 MG tablet Take 1 tablet (500 mg total) by mouth 2 (two) times daily with a meal.   predniSONE (DELTASONE) 20 MG tablet Take 1 tablet (20 mg total) by mouth daily with breakfast.   predniSONE (STERAPRED UNI-PAK 21 TAB) 10 MG (21) TBPK tablet Take by mouth daily. Take 6 tabs by mouth daily  for 2 days, then 5 tabs for 2 days, then 4 tabs for 2 days, then 3 tabs for 2 days, 2 tabs for 2 days, then 1 tab by mouth daily for 2 days  (Patient not taking: Reported on 01/27/2023)   No facility-administered medications prior to visit.    Review of Systems All negative Except see HPI       Objective    BP 126/83 (BP Location: Left Arm, Patient Position: Sitting, Cuff Size: Large)   Pulse 88   Resp 16   Ht 5\' 3"  (1.6 m)   Wt 241 lb 12.8 oz (109.7 kg)   LMP 03/29/2023   SpO2 100%   BMI 42.83 kg/m     Physical Exam Vitals reviewed.  Constitutional:      General: She is not in acute distress.    Appearance: Normal appearance. She is well-developed. She is not diaphoretic.  HENT:     Head: Normocephalic and atraumatic.  Eyes:     General: No scleral icterus.    Conjunctiva/sclera: Conjunctivae normal.  Neck:     Thyroid: No thyromegaly.  Cardiovascular:     Rate and Rhythm: Normal rate and regular rhythm.     Pulses: Normal pulses.     Heart sounds: Normal heart sounds. No murmur heard. Pulmonary:     Effort: Pulmonary effort is normal. No respiratory distress.     Breath sounds: Normal breath sounds. No wheezing, rhonchi or rales.  Musculoskeletal:     Cervical back: Neck supple.     Right lower leg: No edema.     Left lower leg: No edema.  Lymphadenopathy:     Cervical: No cervical adenopathy.  Skin:    General: Skin is warm and dry.     Findings: No rash.  Neurological:     Mental Status: She is alert and oriented to person, place, and time. Mental status is at baseline.  Psychiatric:        Mood and Affect: Mood normal.        Behavior: Behavior normal.      No results found for any visits on 04/07/23.      Assessment and Plan    Eczema chronic Improvement noted with use of topical cream and prednisone. -Continue current treatment regimen.  Anxiety and Depression chronic No recent follow-up with mental health services. -Check status of referral to [facility] clinic. Per chart review, she was sent to [facility]  Allergies Has been seen by Allergy Positive allergy testing  to outdoor allergens and dogs. -Continue current management for seasonal allergies.   Obesity chronic Weight Management Patient reports weight fluctuation and lack of appetite. Discussed the benefits of weight loss and potential referral to a nutritionist program. -Consider referral to a nutritionist program for dietary guidance and exercise planning.  Headaches  New onset of headaches following a fall in November. Initially, she did not want to proceed with imaging or referrals. Patient also reports blurry vision and double vision. Neurological exam within normal limits. -Order CT scan of the head without contrast. -Consider referral to neurology depending on CT results.  Sinus Infection Patient reports symptoms suggestive of sinus infection including cough and sinus drainage. Advised symptomatic treatment. -Consider further evaluation and treatment as needed.  Hyperlipidemia Elevated triglycerides noted on previous lab work. -Advise patient to take over-the-counter Omega-3 supplements to help lower triglyceride levels. -Order basal metabolic panel and lipid panel.  Follow-up in approximately three months to monitor blood pressure and overall health status.      Orders Placed This Encounter  Procedures   CT HEAD WO CONTRAST ( )    Standing Status:   Future    Expiration Date:   04/06/2024    Is patient pregnant?:   No    Preferred imaging location?:   [facility]   Basic Metabolic Panel (BMET)    Return in about 3 months (around 07/06/2023) for chronic disease f/u.   The patient was advised to call back or seek an in-person evaluation if the symptoms worsen or if the condition fails to improve as anticipated.  I discussed the assessment and treatment plan with the patient. The patient was provided an opportunity to ask questions and all were answered. The patient agreed with the plan and demonstrated an understanding of the instructions.  I, Debera Lat, PA-C have  reviewed all documentation for this visit. The documentation on 04/07/2023  for the exam, diagnosis, procedures, and orders are all accurate and complete.  Debera Lat, [facility], MMS [facility] [phone] (phone) [phone] (fax)  [facility]

## 2023-04-08 LAB — BASIC METABOLIC PANEL
BUN/Creatinine Ratio: 15 (ref 9–23)
BUN: 14 mg/dL (ref 6–24)
CO2: 23 mmol/L (ref 20–29)
Calcium: 8.8 mg/dL (ref 8.7–10.2)
Chloride: 106 mmol/L (ref 96–106)
Creatinine, Ser: 0.91 mg/dL (ref 0.57–1.00)
Glucose: 99 mg/dL (ref 70–99)
Potassium: 4.8 mmol/L (ref 3.5–5.2)
Sodium: 144 mmol/L (ref 134–144)
eGFR: 79 mL/min/{1.73_m2} (ref >59)

## 2023-04-14 ENCOUNTER — Ambulatory Visit
Admission: RE | Admit: 2023-04-14 | Discharge: 2023-04-14 | Disposition: A | Discharge status: Home / Self Care | Payer: Medicaid Other | Source: Ambulatory Visit | Attending: Physician Assistant | Admitting: Physician Assistant

## 2023-04-14 DIAGNOSIS — G4489 Other headache syndrome: Secondary | ICD-10-CM | POA: Insufficient documentation

## 2023-04-14 DIAGNOSIS — R519 Headache, unspecified: Secondary | ICD-10-CM | POA: Diagnosis not present

## 2023-04-25 ENCOUNTER — Encounter: Payer: Self-pay | Admitting: Physician Assistant

## 2023-04-28 ENCOUNTER — Ambulatory Visit: Payer: Medicaid Other | Admitting: Dermatology

## 2023-06-20 ENCOUNTER — Ambulatory Visit (INDEPENDENT_AMBULATORY_CARE_PROVIDER_SITE_OTHER): Payer: Medicaid Other | Admitting: Dermatology

## 2023-06-20 ENCOUNTER — Encounter: Payer: Self-pay | Admitting: Dermatology

## 2023-06-20 DIAGNOSIS — L209 Atopic dermatitis, unspecified: Secondary | ICD-10-CM | POA: Diagnosis not present

## 2023-06-20 DIAGNOSIS — L219 Seborrheic dermatitis, unspecified: Secondary | ICD-10-CM | POA: Diagnosis not present

## 2023-06-20 MED ORDER — TACROLIMUS 0.1 % EX OINT: TOPICAL_OINTMENT | Freq: Two times a day (BID) | CUTANEOUS | 0 refills | Status: AC

## 2023-06-20 MED ORDER — KETOCONAZOLE 2 % EX CREA: 1.0000 | TOPICAL_CREAM | Freq: Two times a day (BID) | CUTANEOUS | 1 refills | Status: AC

## 2023-06-20 NOTE — Progress Notes (Signed)
   New Patient Visit   Subjective  Kaylee Mcguire is a 48 y.o. female who presents for the following: eczema at arms, chest, legs. Patient currently using TMC 0.1% cr once daily, has been using for months, does help. Patient has had eczema since childhood.   The patient has spots, moles and lesions to be evaluated, some may be new or changing and the patient may have concern these could be cancer.   The following portions of the chart were reviewed this encounter and updated as appropriate: medications, allergies, medical history  Review of Systems:  No other skin or systemic complaints except as noted in HPI or Assessment and Plan.  Objective  Well appearing patient in no apparent distress; mood and affect are within normal limits.   A focused examination was performed of the following areas: Arms, axilla, legs, abdomen  Relevant exam findings are noted in the Assessment and Plan.    Assessment & Plan   SEBORRHEIC DERMATITIS Exam: Pink patches with greasy scale at nasolabial folds  flared  Seborrheic Dermatitis is a chronic persistent rash characterized by pinkness and scaling most commonly of the mid face but also can occur on the scalp (dandruff), ears; mid chest, mid back and groin.  It tends to be exacerbated by stress and cooler weather.  People who have neurologic disease may experience new onset or exacerbation of existing seborrheic dermatitis.  The condition is not curable but treatable and can be controlled.  Treatment Plan: Start ketoconazole 2% cr twice daily.   ATOPIC DERMATITIS Exam: Scaly pink macules and patches on arms, legs, abdomen, face 15-20% BSA  flared  Atopic dermatitis (eczema) is a chronic, relapsing, pruritic condition that can significantly affect quality of life. It is often associated with allergic rhinitis and/or asthma and can require treatment with topical medications, phototherapy, or in severe cases biologic injectable medication (Dupixent;  Adbry) or Oral JAK inhibitors.  Treatment Plan: Start tacrolimus twice daily to affected areas.  Continue TMC 0.1% cr BID until smooth  Consider Dupixent  Recommend gentle skin care.  ATOPIC DERMATITIS, UNSPECIFIED TYPE   SEBORRHEIC DERMATITIS    Return in about 1 month (around 07/20/2023) for AD, with Dr. Katrinka Blazing.  Anise Salvo, RMA, am acting as scribe for Elie Goody, MD .   Documentation: I have reviewed the above documentation for accuracy and completeness, and I agree with the above.  Elie Goody, MD

## 2023-06-20 NOTE — Patient Instructions (Signed)

## 2023-07-19 ENCOUNTER — Encounter: Payer: Self-pay | Admitting: Physician Assistant

## 2023-07-19 ENCOUNTER — Ambulatory Visit: Payer: Self-pay | Admitting: Physician Assistant

## 2023-07-19 VITALS — BP 133/84 | HR 85 | Resp 16 | Ht 63.0 in | Wt 240.0 lb

## 2023-07-19 DIAGNOSIS — R059 Cough, unspecified: Secondary | ICD-10-CM | POA: Diagnosis not present

## 2023-07-19 DIAGNOSIS — L308 Other specified dermatitis: Secondary | ICD-10-CM | POA: Diagnosis not present

## 2023-07-19 DIAGNOSIS — F32A Depression, unspecified: Secondary | ICD-10-CM

## 2023-07-19 DIAGNOSIS — J302 Other seasonal allergic rhinitis: Secondary | ICD-10-CM

## 2023-07-19 DIAGNOSIS — G4489 Other headache syndrome: Secondary | ICD-10-CM | POA: Diagnosis not present

## 2023-07-19 DIAGNOSIS — E781 Pure hyperglyceridemia: Secondary | ICD-10-CM

## 2023-07-19 DIAGNOSIS — F419 Anxiety disorder, unspecified: Secondary | ICD-10-CM | POA: Diagnosis not present

## 2023-07-19 MED ORDER — ALBUTEROL SULFATE HFA 108 (90 BASE) MCG/ACT IN AERS: 2.0000 | INHALATION_SPRAY | Freq: Four times a day (QID) | RESPIRATORY_TRACT | 2 refills | Status: DC | PRN

## 2023-07-19 NOTE — Progress Notes (Signed)
 Established patient visit  Patient: Kaylee Mcguire   DOB: 12-02-1975   48 y.o. Female  MRN: [id number] Visit Date: 07/19/2023  Today's healthcare provider: Blane Bunting, PA-C   Chief Complaint  Patient presents with   Follow-up    Management of chronic conditions   Subjective     HPI    Patient with a medical history of skin rash, anxiety, depression, weight gain, fall, with subsequent headaches present for 3 months follow-up.  Patient reports that most of the symptoms of sinusitis improved except persistent cough x 2 months     07/19/2023    3:20 PM 04/07/2023    3:13 PM 02/24/2023    3:08 PM  Depression screen PHQ 2/9  Decreased Interest 1 1 1   Down, Depressed, Hopeless 1 1 3   PHQ - 2 Score 2 2 4   Altered sleeping 1 0 1  Tired, decreased energy 1 1 2   Change in appetite 1 2 0  Feeling bad or failure about yourself  1 1 1   Trouble concentrating 1 2 3   Moving slowly or fidgety/restless 0 1 1  Suicidal thoughts 0 0 0  PHQ-9 Score 7 9 12   Difficult doing work/chores Somewhat difficult Somewhat difficult Somewhat difficult      07/19/2023    3:21 PM 04/07/2023    3:14 PM 02/24/2023    3:08 PM  GAD 7 : Generalized Anxiety Score  Nervous, Anxious, on Edge 1 2 2   Control/stop worrying 1 1 2   Worry too much - different things 1 2 2   Trouble relaxing 1 2 2   Restless 1 2 2   Easily annoyed or irritable 1 2 2   Afraid - awful might happen 1 1 2   Total GAD 7 Score 7 12 14   Anxiety Difficulty Somewhat difficult Somewhat difficult Not difficult at all    Medications: Outpatient Medications Prior to Visit  Medication Sig   acetaminophen  (TYLENOL ) 500 MG tablet Take 500 mg by mouth 3 (three) times daily.   cetirizine  (ZYRTEC ) 10 MG tablet Take 1 tablet (10 mg total) by mouth daily.   diclofenac (VOLTAREN) 75 MG EC tablet Take 75 mg by mouth 2 (two) times daily as needed (pain).   EPINEPHrine 0.3 mg/0.3 mL IJ SOAJ injection as directed Injection prn for systemic reactions for 30  days   fluticasone (FLONASE) 50 MCG/ACT nasal spray Place 1 spray into both nostrils daily.   ketoconazole  (NIZORAL ) 2 % cream Apply 1 Application topically 2 (two) times daily.   lisinopril  (ZESTRIL ) 5 MG tablet Take 1 tablet (5 mg total) by mouth daily.   methocarbamol (ROBAXIN) 500 MG tablet Take 500 mg by mouth 4 (four) times daily.   Olopatadine HCl 0.2 % SOLN 1 drop into affected eye Ophthalmic Once a day or prn for 30 days   tacrolimus  (PROTOPIC ) 0.1 % ointment Apply topically 2 (two) times daily.   triamcinolone  cream (KENALOG ) 0.1 % Apply 1 Application topically 2 (two) times daily.   No facility-administered medications prior to visit.    Review of Systems All negative Except see HPI       Objective    BP 133/84 (BP Location: Right Arm, Patient Position: Sitting, Cuff Size: Normal)   Pulse 85   Resp 16   Ht 5\' 3"  (1.6 m)   Wt 240 lb (108.9 kg)   SpO2 95%   BMI 42.51 kg/m     Physical Exam Vitals reviewed.  Constitutional:      General: She is not in  acute distress.    Appearance: Normal appearance. She is well-developed. She is not diaphoretic.  HENT:     Head: Normocephalic and atraumatic.  Eyes:     General: No scleral icterus.    Conjunctiva/sclera: Conjunctivae normal.  Neck:     Thyroid: No thyromegaly.  Cardiovascular:     Rate and Rhythm: Normal rate and regular rhythm.     Pulses: Normal pulses.     Heart sounds: Normal heart sounds. No murmur heard. Pulmonary:     Effort: Pulmonary effort is normal. No respiratory distress.     Breath sounds: Normal breath sounds. No wheezing, rhonchi or rales.  Musculoskeletal:     Cervical back: Neck supple.     Right lower leg: No edema.     Left lower leg: No edema.  Lymphadenopathy:     Cervical: No cervical adenopathy.  Skin:    General: Skin is warm and dry.     Findings: No rash.  Neurological:     Mental Status: She is alert and oriented to person, place, and time. Mental status is at baseline.   Psychiatric:        Mood and Affect: Mood normal.        Behavior: Behavior normal.      Results for orders placed or performed in visit on 07/19/23  CBC with Differential/Platelet  Result Value Ref Range   WBC 8.9 3.4 - 10.8 x10E3/uL   RBC 4.41 3.77 - 5.28 x10E6/uL   Hemoglobin 13.4 11.1 - 15.9 g/dL   Hematocrit 16.1 09.6 - 46.6 %   MCV 92 79 - 97 fL   MCH 30.4 26.6 - 33.0 pg   MCHC 33.1 31.5 - 35.7 g/dL   RDW 04.5 40.9 - 81.1 %   Platelets 325 150 - 450 x10E3/uL   Neutrophils 68 Not Estab. %   Lymphs 19 Not Estab. %   Monocytes 7 Not Estab. %   Eos 6 Not Estab. %   Basos 0 Not Estab. %   Neutrophils Absolute 5.9 1.4 - 7.0 x10E3/uL   Lymphocytes Absolute 1.7 0.7 - 3.1 x10E3/uL   Monocytes Absolute 0.6 0.1 - 0.9 x10E3/uL   EOS (ABSOLUTE) 0.6 (H) 0.0 - 0.4 x10E3/uL   Basophils Absolute 0.0 0.0 - 0.2 x10E3/uL   Immature Granulocytes 0 Not Estab. %   Immature Grans (Abs) 0.0 0.0 - 0.1 x10E3/uL  Comprehensive metabolic panel with GFR  Result Value Ref Range   Glucose 96 70 - 99 mg/dL   BUN 19 6 - 24 mg/dL   Creatinine, Ser 9.14 0.57 - 1.00 mg/dL   eGFR 71 >78 GN/FAO/1.30   BUN/Creatinine Ratio 19 9 - 23   Sodium 140 134 - 144 mmol/L   Potassium 4.6 3.5 - 5.2 mmol/L   Chloride 107 (H) 96 - 106 mmol/L   CO2 19 (L) 20 - 29 mmol/L   Calcium 8.8 8.7 - 10.2 mg/dL   Total Protein 6.2 6.0 - 8.5 g/dL   Albumin 4.1 3.9 - 4.9 g/dL   Globulin, Total 2.1 1.5 - 4.5 g/dL   Bilirubin Total 0.3 0.0 - 1.2 mg/dL   Alkaline Phosphatase 58 44 - 121 IU/L   AST 16 0 - 40 IU/L   ALT 16 0 - 32 IU/L  Lipid panel  Result Value Ref Range   Cholesterol, Total 156 100 - 199 mg/dL   Triglycerides 97 0 - 149 mg/dL   HDL 45 >86 mg/dL   VLDL Cholesterol Cal 18 5 -  40 mg/dL   LDL Chol Calc (NIH) 93 0 - 99 mg/dL   Chol/HDL Ratio 3.5 0.0 - 4.4 ratio        Assessment & Plan  Other eczema (Primary) Chronic On topical steroids No follow-up  Anxiety and depression Chronic    07/19/2023     3:21 PM 04/07/2023    3:14 PM 02/24/2023    3:08 PM  GAD 7 : Generalized Anxiety Score  Nervous, Anxious, on Edge 1 2 2   Control/stop worrying 1 1 2   Worry too much - different things 1 2 2   Trouble relaxing 1 2 2   Restless 1 2 2   Easily annoyed or irritable 1 2 2   Afraid - awful might happen 1 1 2   Total GAD 7 Score 7 12 14   Anxiety Difficulty Somewhat difficult Somewhat difficult Not difficult at all       07/19/2023    3:20 PM 04/07/2023    3:13 PM 02/24/2023    3:08 PM  PHQ9 SCORE ONLY  PHQ-9 Total Score 7 9 12     BH referral was sent from [facility] to [facility], still pending However report patient was informed about our [facility]/an urgent care clinic for mental health in [place]  Allergic Rhinitis: Chronic Allergy testing was positive to outdoor allergens and dogs - Avoidance measures discussed. - Use oral counter nasal saline rinses before nose sprays such as with Neilmed Sinus Rinse bottle.  Use distilled water.   - Use over-the-counter Flonase 2 sprays each nostril daily. Aim upward and outward. - Use over-the-counter Zyrtec  10 mg daily.   Morbid obesity (HCC) Chronic, Body mass index is 42.51 kg/m. Advised weight loss via healthy diet and exercise daily Workup ordered - CBC with Differential/Platelet - Comprehensive metabolic panel with GFR - Lipid panel And will reassess after receiving lab results Advised to consider a referral to nutritionist for dietary guidance  Other headache syndrome Recent onset  CT scan from 04/14/2023 was negative for acute abnormalities Neurological exam was normal  Hypertriglyceridemia Chronic  patient was advised to continue omega-3 supplements We will follow-up  Cough in adult Symptoms subsided except cough ipratropium was prescribed RTC if symptom persist Orders Placed This Encounter  Procedures   CBC with Differential/Platelet   Comprehensive metabolic panel with GFR    Has the patient fasted?:   Yes   Lipid  panel    Has the patient fasted?:   Yes    Return in about 3 months (around 10/18/2023) for chronic disease f/u.   The patient was advised to call back or seek an in-person evaluation if the symptoms worsen or if the condition fails to improve as anticipated.  I discussed the assessment and treatment plan with the patient. The patient was provided an opportunity to ask questions and all were answered. The patient agreed with the plan and demonstrated an understanding of the instructions.  I, Faylinn Schwenn, PA-C have reviewed all documentation for this visit. The documentation on 07/19/2023  for the exam, diagnosis, procedures, and orders are all accurate and complete.  Blane Bunting, [facility], MMS [facility] [phone] (phone) [phone] (fax)  [facility]

## 2023-07-20 ENCOUNTER — Telehealth: Payer: Self-pay

## 2023-07-20 DIAGNOSIS — R059 Cough, unspecified: Secondary | ICD-10-CM

## 2023-07-20 MED ORDER — ALBUTEROL SULFATE HFA 108 (90 BASE) MCG/ACT IN AERS: 2.0000 | INHALATION_SPRAY | Freq: Four times a day (QID) | RESPIRATORY_TRACT | 2 refills | Status: AC | PRN

## 2023-07-20 NOTE — Telephone Encounter (Signed)
Rx updated and sent.

## 2023-07-20 NOTE — Telephone Encounter (Signed)
 Copied from CRM [id number]. Topic: Clinical - Prescription Issue >> Jul 19, 2023  5:26 PM Kaylee Mcguire wrote: Reason for CRM: The patient's pharmacy has called to share that the patient's insurance needs a new prescription of the medication submitted with a specified quantity of 18 for coverage   Please contact the patients pharmacy further if needed

## 2023-07-21 ENCOUNTER — Encounter: Payer: Self-pay | Admitting: Physician Assistant

## 2023-07-21 LAB — CBC WITH DIFFERENTIAL/PLATELET
Basophils Absolute: 0 10*3/uL (ref 0.0–0.2)
Basos: 0 %
EOS (ABSOLUTE): 0.6 10*3/uL — ABNORMAL HIGH (ref 0.0–0.4)
Eos: 6 %
Hematocrit: 40.5 % (ref 34.0–46.6)
Hemoglobin: 13.4 g/dL (ref 11.1–15.9)
Immature Grans (Abs): 0 10*3/uL (ref 0.0–0.1)
Immature Granulocytes: 0 %
Lymphocytes Absolute: 1.7 10*3/uL (ref 0.7–3.1)
Lymphs: 19 %
MCH: 30.4 pg (ref 26.6–33.0)
MCHC: 33.1 g/dL (ref 31.5–35.7)
MCV: 92 fL (ref 79–97)
Monocytes Absolute: 0.6 10*3/uL (ref 0.1–0.9)
Monocytes: 7 %
Neutrophils Absolute: 5.9 10*3/uL (ref 1.4–7.0)
Neutrophils: 68 %
Platelets: 325 10*3/uL (ref 150–450)
RBC: 4.41 x10E6/uL (ref 3.77–5.28)
RDW: 12.7 % (ref 11.7–15.4)
WBC: 8.9 10*3/uL (ref 3.4–10.8)

## 2023-07-21 LAB — LIPID PANEL
Chol/HDL Ratio: 3.5 ratio (ref 0.0–4.4)
Cholesterol, Total: 156 mg/dL (ref 100–199)
HDL: 45 mg/dL (ref >39)
LDL Chol Calc (NIH): 93 mg/dL (ref 0–99)
Triglycerides: 97 mg/dL (ref 0–149)
VLDL Cholesterol Cal: 18 mg/dL (ref 5–40)

## 2023-07-21 LAB — COMPREHENSIVE METABOLIC PANEL WITH GFR
ALT: 16 IU/L (ref 0–32)
AST: 16 IU/L (ref 0–40)
Albumin: 4.1 g/dL (ref 3.9–4.9)
Alkaline Phosphatase: 58 IU/L (ref 44–121)
BUN/Creatinine Ratio: 19 (ref 9–23)
BUN: 19 mg/dL (ref 6–24)
Bilirubin Total: 0.3 mg/dL (ref 0.0–1.2)
CO2: 19 mmol/L — ABNORMAL LOW (ref 20–29)
Calcium: 8.8 mg/dL (ref 8.7–10.2)
Chloride: 107 mmol/L — ABNORMAL HIGH (ref 96–106)
Creatinine, Ser: 0.99 mg/dL (ref 0.57–1.00)
Globulin, Total: 2.1 g/dL (ref 1.5–4.5)
Glucose: 96 mg/dL (ref 70–99)
Potassium: 4.6 mmol/L (ref 3.5–5.2)
Sodium: 140 mmol/L (ref 134–144)
Total Protein: 6.2 g/dL (ref 6.0–8.5)
eGFR: 71 mL/min/{1.73_m2} (ref >59)

## 2023-07-22 MED ORDER — IPRATROPIUM BROMIDE 0.03 % NA SOLN: 2.0000 | Freq: Two times a day (BID) | NASAL | 0 refills | Status: AC

## 2023-07-28 DIAGNOSIS — J3089 Other allergic rhinitis: Secondary | ICD-10-CM | POA: Diagnosis not present

## 2023-07-28 DIAGNOSIS — L309 Dermatitis, unspecified: Secondary | ICD-10-CM | POA: Diagnosis not present

## 2023-07-28 DIAGNOSIS — H1045 Other chronic allergic conjunctivitis: Secondary | ICD-10-CM | POA: Diagnosis not present

## 2023-07-28 DIAGNOSIS — R052 Subacute cough: Secondary | ICD-10-CM | POA: Diagnosis not present

## 2023-08-01 ENCOUNTER — Ambulatory Visit (INDEPENDENT_AMBULATORY_CARE_PROVIDER_SITE_OTHER): Admitting: Dermatology

## 2023-08-01 ENCOUNTER — Encounter: Payer: Self-pay | Admitting: Dermatology

## 2023-08-01 DIAGNOSIS — Z79899 Other long term (current) drug therapy: Secondary | ICD-10-CM | POA: Diagnosis not present

## 2023-08-01 DIAGNOSIS — L209 Atopic dermatitis, unspecified: Secondary | ICD-10-CM | POA: Diagnosis not present

## 2023-08-01 DIAGNOSIS — Z7189 Other specified counseling: Secondary | ICD-10-CM

## 2023-08-01 MED ORDER — DUPIXENT 300 MG/2ML ~~LOC~~ SOAJ: 300.0000 mg | SUBCUTANEOUS | 5 refills | Status: DC

## 2023-08-01 MED ORDER — DUPILUMAB 300 MG/2ML ~~LOC~~ SOAJ
600.0000 mg | Freq: Once | SUBCUTANEOUS | Status: AC
  Administered 2023-08-01: 600 mg via SUBCUTANEOUS

## 2023-08-01 NOTE — Patient Instructions (Signed)

## 2023-08-01 NOTE — Progress Notes (Signed)
   Follow-Up Visit   Subjective  Kaylee Mcguire is a 48 y.o. female who presents for the following: Atopic Dermatitis - TMC 0.1% QAM after showering, which helps, but it doesn't completely clear eczema patches. Pt was unable to pick up tacrolimus  0.1% ointment, because she said it was not covered by insurance. Pt states the pharmacy was supposed to contact us  to ask for alternative, but our office has not heard from them. Patient interested in starting Dupixent.   The following portions of the chart were reviewed this encounter and updated as appropriate: medications, allergies, medical history  Review of Systems:  No other skin or systemic complaints except as noted in HPI or Assessment and Plan.  Objective  Well appearing patient in no apparent distress; mood and affect are within normal limits.  Areas Examined: The face, arms, and legs  Relevant physical exam findings are noted in the Assessment and Plan.    Assessment & Plan   ATOPIC DERMATITIS, UNSPECIFIED TYPE   Related Medications Dupilumab SOAJ 600 mg  LONG-TERM USE OF HIGH-RISK MEDICATION   COUNSELING AND COORDINATION OF CARE     ATOPIC DERMATITIS - Tacrolimus  0.1% ointment not approved by insurance. Patient has tried and failed Triamcinolone  0.1% cream.  Exam: Scaly pink papules coalescing to plaques on arms legs back chin 15% BSA  Chronic and persistent condition with duration or expected duration over one year. Condition is symptomatic/ bothersome to patient. Not currently at goal.  Atopic dermatitis (eczema) is a chronic, relapsing, pruritic condition that can significantly affect quality of life. It is often associated with allergic rhinitis and/or asthma and can require treatment with topical medications, phototherapy, or in severe cases biologic injectable medication (Dupixent; Adbry) or Oral JAK inhibitors.  Treatment Plan: Start Dupixent 300mg /10mL SQ Q2W. Dupixent 300mg /6mL injected SQ into the R ant  thigh. Patient tolerated injection well. Educated patient on how to inject Dupixent 300mg /73mL SQ. Patient injected Dupixent 300mg /83mL SQ into the L ant thigh. Patient tolerated injection well and is comfortable self injecting at home. Sample box of pens given to patient. She should contact the office for more samples, if insurance hasn't approved medication by her 6 week follow up.  Dupilumab (Dupixent) is a treatment given by injection for adults and children with moderate-to-severe atopic dermatitis. Goal is control of skin condition, not cure. It is given as 2 injections at the first dose followed by 1 injection ever 2 weeks thereafter.  Young children are dosed monthly.  Potential side effects include allergic reaction, herpes infections, injection site reactions and conjunctivitis (inflammation of the eyes).  The use of Dupixent requires long term medication management, including periodic office visits.  Recommend gentle skin care.   Return in about 6 weeks (around 09/12/2023) for Dupixent/atopic dermatitis follow up.  Arlinda Lais, CMA, am acting as scribe for Harris Liming, MD .  Documentation: I have reviewed the above documentation for accuracy and completeness, and I agree with the above.  Harris Liming, MD

## 2023-08-30 NOTE — Progress Notes (Signed)
 " Established patient visit  Patient: Kaylee Mcguire   DOB: [birth year]   48 y.o. Female  MRN: [id number] Visit Date: 08/31/2023  Today's healthcare provider: Jolynn Spencer, PA-C   Chief Complaint  Patient presents with   Back Pain    Referral for back pain No other concerns   Subjective     HPI     Back Pain    Additional comments: Referral for back pain No other concerns      Last edited by Marylen Odella LITTIE, CMA on 08/31/2023  1:10 PM.       Discussed the use of AI scribe software for clinical note transcription with the patient, who gave verbal consent to proceed.  History of Present Illness Kaylee Mcguire is a 48 year old female who presents with chronic pain management issues. She is accompanied by her grandson, Kaylee Mcguire.  She experiences chronic pain that limits her daily activities, including playing with her grandson. She is interested in non-narcotic pain management options, such as injections. She has difficulty attending appointments at [facility] due to distance. She takes Voltaren  twice daily as needed. She is considering physical therapy but has scheduling challenges due to unreturned calls.  She has lost weight, now weighing 240 pounds, due to decreased appetite and increased physical activity from caring for her grandson. Lifting heavy items at work worsens her back pain, though coworkers assist when possible.  She has eczema, improved with Dupixent  shots, and is scheduled for allergy testing on the 25th. She experiences blurry vision and seeks an eye doctor who accepts her insurance. She uses reading glasses but finds them insufficient for close-up tasks.  She has not consumed alcohol since 2014 after a significant life event. She works and is actively involved in caring for her grandson.   Referred for physical therapy by orthopedics. Patient and provider will both attempt to find a physical therapy provider that accepts Medicaid. -Place referral for physical  therapy. -Patient to contact Medicaid to find a physical therapy provider in the local area. - Ambulatory referral to Physical Therapy     08/31/2023    2:12 PM 07/19/2023    3:20 PM 04/07/2023    3:13 PM  Depression screen PHQ 2/9  Decreased Interest 2 1 1   Down, Depressed, Hopeless 2 1 1   PHQ - 2 Score 4 2 2   Altered sleeping 2 1 0  Tired, decreased energy 2 1 1   Change in appetite 2 1 2   Feeling bad or failure about yourself  2 1 1   Trouble concentrating 2 1 2   Moving slowly or fidgety/restless 2 0 1  Suicidal thoughts 0 0 0  PHQ-9 Score 16 7 9   Difficult doing work/chores Somewhat difficult Somewhat difficult Somewhat difficult      08/31/2023    2:12 PM 07/19/2023    3:21 PM 04/07/2023    3:14 PM 02/24/2023    3:08 PM  GAD 7 : Generalized Anxiety Score  Nervous, Anxious, on Edge 2 1 2 2   Control/stop worrying 1 1 1 2   Worry too much - different things 2 1 2 2   Trouble relaxing 2 1 2 2   Restless 2 1 2 2   Easily annoyed or irritable 2 1 2 2   Afraid - awful might happen 2 1 1 2   Total GAD 7 Score 13 7 12 14   Anxiety Difficulty Somewhat difficult Somewhat difficult Somewhat difficult Not difficult at all    Medications: Outpatient Medications Prior to Visit  Medication  Sig   acetaminophen  (TYLENOL ) 500 MG tablet Take 500 mg by mouth 3 (three) times daily.   albuterol  (VENTOLIN  HFA) 108 (90 Base) MCG/ACT inhaler Inhale 2 puffs into the lungs every 6 (six) hours as needed for wheezing or shortness of breath.   cetirizine  (ZYRTEC ) 10 MG tablet Take 1 tablet (10 mg total) by mouth daily.   Dupilumab  (DUPIXENT ) 300 MG/2ML SOAJ Inject 300 mg into the skin every 14 (fourteen) days. Starting at day 15 for maintenance.   EPINEPHrine 0.3 mg/0.3 mL IJ SOAJ injection as directed Injection prn for systemic reactions for 30 days   fluticasone  (FLONASE) 50 MCG/ACT nasal spray Place 1 spray into both nostrils daily.   lisinopril  (ZESTRIL ) 5 MG tablet Take 1 tablet (5 mg total) by mouth  daily.   methocarbamol (ROBAXIN) 500 MG tablet Take 500 mg by mouth 4 (four) times daily.   Olopatadine HCl 0.2 % SOLN 1 drop into affected eye Ophthalmic Once a day or prn for 30 days   tacrolimus  (PROTOPIC ) 0.1 % ointment Apply topically 2 (two) times daily.   triamcinolone  cream (KENALOG ) 0.1 % Apply 1 Application topically 2 (two) times daily.   [DISCONTINUED] diclofenac  (VOLTAREN ) 75 MG EC tablet Take 75 mg by mouth 2 (two) times daily as needed (pain).   ipratropium (ATROVENT ) 0.03 % nasal spray Place 2 sprays into both nostrils every 12 (twelve) hours for 3 days.   No facility-administered medications prior to visit.    Review of Systems  All other systems reviewed and are negative.  All negative Except see HPI       Objective    BP 129/78 (BP Location: Left Arm, Patient Position: Sitting, Cuff Size: Large)   Pulse 81   Resp 16   Ht 5' 3 (1.6 m)   Wt 240 lb (108.9 kg)   SpO2 97%   BMI 42.51 kg/m     Physical Exam Vitals reviewed.  Constitutional:      General: She is not in acute distress.    Appearance: Normal appearance. She is well-developed. She is not diaphoretic.  HENT:     Head: Normocephalic and atraumatic.   Eyes:     General: No scleral icterus.    Conjunctiva/sclera: Conjunctivae normal.   Neck:     Thyroid : No thyromegaly.   Cardiovascular:     Rate and Rhythm: Normal rate and regular rhythm.     Pulses: Normal pulses.     Heart sounds: Normal heart sounds. No murmur heard. Pulmonary:     Effort: Pulmonary effort is normal. No respiratory distress.     Breath sounds: Normal breath sounds. No wheezing, rhonchi or rales.   Musculoskeletal:     Cervical back: Neck supple.     Right lower leg: No edema.     Left lower leg: No edema.  Lymphadenopathy:     Cervical: No cervical adenopathy.   Skin:    General: Skin is warm and dry.     Findings: No rash.   Neurological:     Mental Status: She is alert and oriented to person, place, and  time. Mental status is at baseline.   Psychiatric:        Mood and Affect: Mood normal.        Behavior: Behavior normal.      No results found for any visits on 08/31/23.      Assessment & Plan Chronic Pain Chronic pain impacts daily activities and causes emotional distress. She prefers non-narcotic options  and is open to injections. Physical therapy communication issues noted. Advised to avoid alcohol due to medication risks. - was seen by provider from [facility] for management. Needs PT  - Prescribe Voltaren , 60 tablets, twice daily as needed temporarily. - Facilitate communication with physical therapy. - Advise to avoid alcohol. Advised to continue with [facility] but placed a referral to pain clinic per pt's request  Eczema Chronic, improved Approved for Dupixent  injections with symptom improvement despite high cost. - Continue Dupixent  injections. Will follow-up  Vision Problems Blurry vision and difficulty with close-up tasks suggest need for updated prescription glasses. Medicaid and work designer, television/film set for eye care. - Recommend eye exam within six weeks. - Check Medicaid acceptance with [facility].  Obesity/ Weight Management Weight loss and dietary changes noted, but appetite is low. Active lifestyle. Discussed potential weight loss medication if needed. - Refer to weight management services. - Consider weight loss medication if indicated in future visits. Will follow-up  Anxiety and depression    08/31/2023    2:12 PM 07/19/2023    3:20 PM 04/07/2023    3:13 PM  PHQ9 SCORE ONLY  PHQ-9 Total Score 16 7 9   Was on sertraline and zoloft before Referral to [facility] pending Pt can contact [facility] by herself if she wants to proceed with [facility], per scheduler  Morbid obesity (HCC) Chronic  - Ambulatory referral to Pain Clinic - diclofenac  (VOLTAREN ) 75 MG EC tablet; Take 1 tablet (75 mg total) by mouth 2 (two) times daily.  Dispense: 60 tablet; Refill: 1 -  Amb Ref to Medical Weight Management  Hypertriglyceridemia Chronic and improved Will follow-up  Primary hypertension Chronic and stable On lifestyle modifications, unclear if on lisinopril , was dispensed on 07/30/23 Will follow-up   PTSD (post-traumatic stress disorder) Other specified attention deficit hyperactivity disorder (ADHD) Needs to see BH. Referral was placed in 01/2024 Needs to call to [facility] to schedule an appointment if she wants to proceed further.  Needs to bring all her medications to the next appt  Orders Placed This Encounter  Procedures   Ambulatory referral to Pain Clinic    Referral Priority:   Routine    Referral Type:   Consultation    Referral Reason:   Specialty Services Required    Requested Specialty:   Pain Medicine    Number of Visits Requested:   1   Amb Ref to Medical Weight Management    Referral Priority:   Routine    Referral Type:   Consultation    Number of Visits Requested:   1    Return in about 6 weeks (around 10/12/2023).   The patient was advised to call back or seek an in-person evaluation if the symptoms worsen or if the condition fails to improve as anticipated.  I discussed the assessment and treatment plan with the patient. The patient was provided an opportunity to ask questions and all were answered. The patient agreed with the plan and demonstrated an understanding of the instructions.  I, Eunique Balik, PA-C have reviewed all documentation for this visit. The documentation on 08/31/2023  for the exam, diagnosis, procedures, and orders are all accurate and complete.  Jolynn Spencer, [facility], MMS [facility] [phone] (phone) [phone] (fax)  [facility] "

## 2023-08-31 ENCOUNTER — Encounter: Payer: Self-pay | Admitting: Physician Assistant

## 2023-08-31 ENCOUNTER — Ambulatory Visit: Admitting: Physician Assistant

## 2023-08-31 VITALS — BP 129/78 | HR 81 | Resp 16 | Ht 63.0 in | Wt 240.0 lb

## 2023-08-31 DIAGNOSIS — M5441 Lumbago with sciatica, right side: Secondary | ICD-10-CM

## 2023-08-31 DIAGNOSIS — E781 Pure hyperglyceridemia: Secondary | ICD-10-CM | POA: Diagnosis not present

## 2023-08-31 DIAGNOSIS — F419 Anxiety disorder, unspecified: Secondary | ICD-10-CM | POA: Diagnosis not present

## 2023-08-31 DIAGNOSIS — L308 Other specified dermatitis: Secondary | ICD-10-CM | POA: Diagnosis not present

## 2023-08-31 DIAGNOSIS — F908 Attention-deficit hyperactivity disorder, other type: Secondary | ICD-10-CM

## 2023-08-31 DIAGNOSIS — J302 Other seasonal allergic rhinitis: Secondary | ICD-10-CM

## 2023-08-31 DIAGNOSIS — I10 Essential (primary) hypertension: Secondary | ICD-10-CM | POA: Diagnosis not present

## 2023-08-31 DIAGNOSIS — G8929 Other chronic pain: Secondary | ICD-10-CM

## 2023-08-31 DIAGNOSIS — F32A Depression, unspecified: Secondary | ICD-10-CM

## 2023-08-31 DIAGNOSIS — F4310 Post-traumatic stress disorder, unspecified: Secondary | ICD-10-CM

## 2023-08-31 DIAGNOSIS — G4489 Other headache syndrome: Secondary | ICD-10-CM

## 2023-08-31 DIAGNOSIS — K588 Other irritable bowel syndrome: Secondary | ICD-10-CM

## 2023-08-31 DIAGNOSIS — R600 Localized edema: Secondary | ICD-10-CM

## 2023-08-31 MED ORDER — DICLOFENAC SODIUM 75 MG PO TBEC
75.0000 mg | DELAYED_RELEASE_TABLET | Freq: Two times a day (BID) | ORAL | 1 refills | Status: DC
Start: 2023-08-31 — End: 2023-09-28

## 2023-09-05 ENCOUNTER — Ambulatory Visit: Admitting: Dermatology

## 2023-09-07 ENCOUNTER — Ambulatory Visit (INDEPENDENT_AMBULATORY_CARE_PROVIDER_SITE_OTHER): Payer: Self-pay | Admitting: Psychiatry

## 2023-09-07 ENCOUNTER — Other Ambulatory Visit: Payer: Self-pay

## 2023-09-07 ENCOUNTER — Encounter: Payer: Self-pay | Admitting: Psychiatry

## 2023-09-07 VITALS — BP 139/86 | HR 83 | Temp 97.3°F | Ht 63.0 in | Wt 242.8 lb

## 2023-09-07 DIAGNOSIS — F314 Bipolar disorder, current episode depressed, severe, without psychotic features: Secondary | ICD-10-CM

## 2023-09-07 DIAGNOSIS — F411 Generalized anxiety disorder: Secondary | ICD-10-CM

## 2023-09-07 DIAGNOSIS — F4321 Adjustment disorder with depressed mood: Secondary | ICD-10-CM | POA: Diagnosis not present

## 2023-09-07 MED ORDER — CARIPRAZINE HCL 1.5 MG PO CAPS: 1.5000 mg | ORAL_CAPSULE | Freq: Every day | ORAL | 0 refills | Status: DC

## 2023-09-07 MED ORDER — CARIPRAZINE HCL 1.5 MG PO CAPS: 1.5000 mg | ORAL_CAPSULE | Freq: Every day | ORAL | Status: DC
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
## 2023-09-12 ENCOUNTER — Encounter: Payer: Self-pay | Admitting: Dermatology

## 2023-09-12 ENCOUNTER — Ambulatory Visit (INDEPENDENT_AMBULATORY_CARE_PROVIDER_SITE_OTHER): Admitting: Dermatology

## 2023-09-12 DIAGNOSIS — Z7189 Other specified counseling: Secondary | ICD-10-CM

## 2023-09-12 DIAGNOSIS — L209 Atopic dermatitis, unspecified: Secondary | ICD-10-CM

## 2023-09-12 DIAGNOSIS — Z79899 Other long term (current) drug therapy: Secondary | ICD-10-CM | POA: Diagnosis not present

## 2023-09-12 MED ORDER — DUPIXENT 300 MG/2ML ~~LOC~~ SOAJ
300.0000 mg | SUBCUTANEOUS | 5 refills | Status: AC
Start: 2023-09-12 — End: ?

## 2023-09-12 NOTE — Progress Notes (Signed)
   Follow-Up Visit   Subjective  Kaylee Mcguire is a 48 y.o. female who presents for the following: Atopic Dermatitis - pt doing well with Dupixent  injection Q2W, not having side effects, and no longer having to use topical medications for condition.   The following portions of the chart were reviewed this encounter and updated as appropriate: medications, allergies, medical history  Review of Systems:  No other skin or systemic complaints except as noted in HPI or Assessment and Plan.  Objective  Well appearing patient in no apparent distress; mood and affect are within normal limits.  Areas Examined: The extremities  Relevant physical exam findings are noted in the Assessment and Plan.    Assessment & Plan   ATOPIC DERMATITIS, UNSPECIFIED TYPE   LONG-TERM USE OF HIGH-RISK MEDICATION   COUNSELING AND COORDINATION OF CARE   MEDICATION MANAGEMENT    ATOPIC DERMATITIS, clear on Dupixent . Failed triamcinolone . Tacrolimus  not approved. Exam: clear 0% BSA improved from 15-20%  Chronic condition with duration or expected duration over one year. Currently well-controlled.  Atopic dermatitis (eczema) is a chronic, relapsing, pruritic condition that can significantly affect quality of life. It is often associated with allergic rhinitis and/or asthma and can require treatment with topical medications, phototherapy, or in severe cases biologic injectable medication (Dupixent ; Adbry) or Oral JAK inhibitors.  Treatment Plan: Continue Dupixent  300mg /2mL SQ Q2W. Dupilumab  (Dupixent ) is a treatment given by injection for adults and children with moderate-to-severe atopic dermatitis. Goal is control of skin condition, not cure. It is given as 2 injections at the first dose followed by 1 injection ever 2 weeks thereafter.  Young children are dosed monthly.  Potential side effects include allergic reaction, herpes infections, injection site reactions and conjunctivitis (inflammation of the  eyes).  The use of Dupixent  requires long term medication management, including periodic office visits.  Recommend gentle skin care.   Return in about 1 year (around 09/11/2024) for atopic dermatitis/Dupixent  follow up.  LILLETTE Rosina Mayans, CMA, am acting as scribe for Boneta Sharps, MD .   Documentation: I have reviewed the above documentation for accuracy and completeness, and I agree with the above.  Boneta Sharps, MD

## 2023-09-12 NOTE — Patient Instructions (Signed)

## 2023-09-16 ENCOUNTER — Ambulatory Visit: Admitting: Physician Assistant

## 2023-09-21 ENCOUNTER — Encounter: Payer: Self-pay | Admitting: Psychiatry

## 2023-09-21 ENCOUNTER — Other Ambulatory Visit: Payer: Self-pay

## 2023-09-21 ENCOUNTER — Ambulatory Visit (INDEPENDENT_AMBULATORY_CARE_PROVIDER_SITE_OTHER): Admitting: Psychiatry

## 2023-09-21 VITALS — BP 110/77 | HR 88 | Temp 97.1°F | Ht 63.0 in | Wt 241.2 lb

## 2023-09-21 DIAGNOSIS — F411 Generalized anxiety disorder: Secondary | ICD-10-CM | POA: Diagnosis not present

## 2023-09-21 DIAGNOSIS — F314 Bipolar disorder, current episode depressed, severe, without psychotic features: Secondary | ICD-10-CM | POA: Diagnosis not present

## 2023-09-21 DIAGNOSIS — F4321 Adjustment disorder with depressed mood: Secondary | ICD-10-CM | POA: Diagnosis not present

## 2023-09-21 NOTE — Progress Notes (Signed)
 [facility] MD/PA/NP OP Progress Note  09/21/2023 9:52 AM Kaylee Mcguire  MRN:  [id number]  Chief Complaint:  Chief Complaint  Patient presents with   Follow-up   HPI: 48 year old female presenting to [facility] for follow-up.  Patient presents with her grandson stating that she has finally started her job at [employer] and is really happy about the change.  Patient reports that she still works at [employer] but has been cut considerably on her hours.  Patient declines and states that she has seen much improvement in her depression and her anxiety.  Further she states that she is currently being evicted from her house and has to go to the legal proceedings in order to try to stay in her home which she states is external stressor.  Patient reports that the Vraylar  1.5 mg is going well and has been approved through her insurance and that she is taking the medication as prescribed.  Patient with no other questions or concerns at this time patient is agreement with treatment plan patient reports that she will just need to get through these next couple weeks in which she was provided resources from find help with food pantries, work support and housing support.  Patient denies SI, HI, AVH.  Patient will follow up in 2 weeks after the court proceedings to determine whether or not she needs more resources in helping her find a place to live.  Visit Diagnosis:    ICD-10-CM   1. Bipolar disorder, current episode depressed, severe, without psychotic features (HCC)  F31.4     2. Anxiety state  F41.1     3. 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       Past Psychiatric History:  Previous Psych Hospitalizations: - Denies Outpatient treatment:  - Current PCP is Benedict Melody, PA-C Medications Current: - Vraylar  1.5 mg once daily Next Steps: - Evaluate with effectiveness of Vraylar  and monitor symptoms of possible hypomania Medication Trials: - Xanax, side effects and poor response Suicide & Violence: - Currently denies SI,  HI, AVH Substance Use: - Denies Psychotherapy: - [facility] recommended for [facility]  for virtual visits Legal:  - Denies  Past Medical History:  Past Medical History:  Diagnosis Date   ADHD    ADHD (attention deficit hyperactivity disorder)    Allergy    Anxiety    Arthritis    Brain dysfunction    Depression    Eczema    Eczema    IBS (irritable bowel syndrome)    Neuromuscular disorder (HCC)    PTSD (post-traumatic stress disorder)    History reviewed. No pertinent surgical history.  Family Psychiatric History: No additional  Family History:  Family History  Problem Relation Age of Onset   Aneurysm Mother    Diabetes Mother    Hypotension Mother    Allergies Mother    Food Allergy Mother    Cataracts Mother    COPD Mother    Heart disease Father    Diabetes Father    COPD Father    Hypertension Father    Diabetes Brother    Asthma Daughter    Kidney failure Daughter    Irritable bowel syndrome Daughter    Childhood respiratory disease Daughter    Aneurysm Maternal Grandmother    Heart failure Maternal Grandmother    Cancer Maternal Grandfather    Heart attack Paternal Grandmother    Bladder Cancer Neg Hx    Kidney cancer Neg Hx  Social History:  Social History   Socioeconomic History   Marital status: Significant Other    Spouse name: Not on file   Number of children: 2   Years of education: Not on file   Highest education level: Some college, no degree  Occupational History   Not on file  Tobacco Use   Smoking status: Every Day    Current packs/day: 0.50    Types: Cigarettes   Smokeless tobacco: Never  Vaping Use   Vaping status: Never Used  Substance and Sexual Activity   Alcohol use: No   Drug use: No   Sexual activity: Yes    Birth control/protection: None  Other Topics Concern   Not on file  Social History Narrative   Not on file   Social Drivers of Health   Financial Resource Strain: High  Risk (04/07/2023)   Overall Financial Resource Strain (CARDIA)    Difficulty of Paying Living Expenses: Hard  Food Insecurity: Food Insecurity Present (04/07/2023)   Hunger Vital Sign    Worried About Running Out of Food in the Last Year: Often true    Ran Out of Food in the Last Year: Often true  Transportation Needs: No Transportation Needs (04/07/2023)   PRAPARE - Administrator, Civil Service (Medical): No    Lack of Transportation (Non-Medical): No  Physical Activity: Insufficiently Active (04/07/2023)   Exercise Vital Sign    Days of Exercise per Week: 5 days    Minutes of Exercise per Session: 10 min  Stress: Stress Concern Present (04/07/2023)   [employer] of Occupational Health - Occupational Stress Questionnaire    Feeling of Stress : Rather much  Social Connections: Moderately Integrated (04/07/2023)   Social Connection and Isolation Panel    Frequency of Communication with Friends and Family: More than three times a week    Frequency of Social Gatherings with Friends and Family: More than three times a week    Attends Religious Services: More than 4 times per year    Active Member of [employer] or Organizations: No    Attends Banker Meetings: Not on file    Marital Status: Living with partner    Allergies:  Allergies  Allergen Reactions   Gabapentin Hives   Codeine     Metabolic Disorder Labs: Lab Results  Component Value Date   HGBA1C 5.5 01/27/2023   No results found for: PROLACTIN Lab Results  Component Value Date   CHOL 156 07/20/2023   TRIG 97 07/20/2023   HDL 45 07/20/2023   CHOLHDL 3.5 07/20/2023   LDLCALC 93 07/20/2023   LDLCALC 70 01/27/2023   Lab Results  Component Value Date   TSH 1.490 01/27/2023   TSH 1.24 04/18/2013    Therapeutic Level Labs: No results found for: LITHIUM No results found for: VALPROATE No results found for: CBMZ  Current Medications: Current Outpatient Medications  Medication Sig  Dispense Refill   acetaminophen  (TYLENOL ) 500 MG tablet Take 500 mg by mouth 3 (three) times daily.     albuterol  (VENTOLIN  HFA) 108 (90 Base) MCG/ACT inhaler Inhale 2 puffs into the lungs every 6 (six) hours as needed for wheezing or shortness of breath. 18 g 2   cariprazine  (VRAYLAR ) 1.5 MG capsule Take 1 capsule (1.5 mg total) by mouth daily.     cetirizine  (ZYRTEC ) 10 MG tablet Take 1 tablet (10 mg total) by mouth daily. 30 tablet 11   diclofenac  (VOLTAREN ) 75 MG EC tablet Take 1 tablet (  75 mg total) by mouth 2 (two) times daily. 60 tablet 1   Dupilumab  (DUPIXENT ) 300 MG/2ML SOAJ Inject 300 mg into the skin every 14 (fourteen) days. Starting at day 15 for maintenance. 4 mL 5   EPINEPHrine 0.3 mg/0.3 mL IJ SOAJ injection as directed Injection prn for systemic reactions for 30 days     fluticasone (FLONASE) 50 MCG/ACT nasal spray Place 1 spray into both nostrils daily.     ipratropium (ATROVENT ) 0.03 % nasal spray Place 2 sprays into both nostrils every 12 (twelve) hours for 3 days. 30 mL 0   lisinopril  (ZESTRIL ) 5 MG tablet Take 1 tablet (5 mg total) by mouth daily. 90 tablet 3   methocarbamol (ROBAXIN) 500 MG tablet Take 500 mg by mouth 4 (four) times daily.     Olopatadine HCl 0.2 % SOLN 1 drop into affected eye Ophthalmic Once a day or prn for 30 days     tacrolimus  (PROTOPIC ) 0.1 % ointment Apply topically 2 (two) times daily. 100 g 0   triamcinolone  cream (KENALOG ) 0.1 % Apply 1 Application topically 2 (two) times daily. 463 g 0   No current facility-administered medications for this visit.     Musculoskeletal: Strength & Muscle Tone: within normal limits Gait & Station: normal Patient leans: N/A  Psychiatric Specialty Exam: Review of Systems  Constitutional: Negative.   HENT: Negative.    Eyes: Negative.   Respiratory: Negative.    Cardiovascular: Negative.   Gastrointestinal: Negative.   Endocrine: Negative.   Genitourinary: Negative.   Musculoskeletal: Negative.   Skin:  Negative.   Allergic/Immunologic: Negative.   Neurological: Negative.   Hematological: Negative.     Blood pressure 110/77, pulse 88, temperature (!) 97.1 F (36.2 C), temperature source Temporal, height 5' 3 (1.6 m), weight 241 lb 3.2 oz (109.4 kg).Body mass index is 42.73 kg/m.  General Appearance: Well Groomed  Eye Contact:  Good  Speech:  Clear and Coherent  Volume:  Normal  Mood:  Anxious and Depressed  Affect:  Appropriate  Thought Process:  Coherent  Orientation:  Full (Time, Place, and Person)  Thought Content: Logical   Suicidal Thoughts:  No  Homicidal Thoughts:  No  Memory:  Immediate;   Good Recent;   Good Remote;   Good  Judgement:  Good  Insight:  Good  Psychomotor Activity:  Normal  Concentration:  Concentration: Good and Attention Span: Good  Recall:  Good  Fund of Knowledge: Good  Language: Good  Akathisia:  No  Handed:  Right  AIMS (if indicated):   Assets:  Desire for Improvement Financial Resources/Insurance Housing  ADL's:  Intact  Cognition: WNL  Sleep:  Good   Screenings: GAD-7    Flowsheet Row Office Visit from 09/07/2023 in [facility] Office Visit from 08/31/2023 in [facility] Office Visit from 07/19/2023 in [facility] Office Visit from 04/07/2023 in [facility] Office Visit from 02/24/2023 in [facility]  Total GAD-7 Score 14 13 7 12 14    PHQ2-9    Flowsheet Row Office Visit from 09/07/2023 in [facility] Office Visit from 08/31/2023 in [facility] Office Visit from 07/19/2023 in [facility] Office Visit from 04/07/2023 in [facility] Office Visit from 02/24/2023 in [facility]  PHQ-2 Total Score 3 4 2 2 4   PHQ-9 Total Score 13  16 7 9 12      Assessment  and Plan:  Assessment - Diagnosis: Bipolar disorder, current episode depressed, severe, without psychotic features (HCC) [F31.4]  2. Anxiety state [F41.1]  3. Complicated grieving [F43.21]  - Progress: Baseline appointment - Risk Factors: Worsening symptoms, Manic behaviors  Plan - Medications:  Continue Vraylar  1.5mg  once daily for bipolar disorder, pt educated of initial headache and GI Irritability. Pt has been educated on tardive dyskinesia and abnormal movements, to contact clinic and stop medication.  - Psychotherapy: Referred to [facility] for trauma therapy.  - Education: Patient has been educated on how to contact this provider through my chart messaging or by calling the clinic.  Patient also educated on how to when to expect and reply and to give up to 24 hours for the provider to respond to messages sent to the clinic.  Patient has been educated on medications, usage, side effects and adverse reactions. - Follow-Up: Patient will follow up in 2 weeks in person - Referrals: [facility] has been [facility] for [facility]. - Safety Planning:  The patient has been educated, if they should have suicidal thoughts with or without a plan to call 911, or go to the closest emergency department.  Pt verbalized understanding.  Pt denies firearms within the home.  Pt also agrees to call the clinic should they have worsening symptoms before the next appointment.     Patient/Guardian was advised Release of Information must be obtained prior to any record release in order to collaborate their care with an outside provider. Patient/Guardian was advised if they have not already done so to contact the registration department to sign all necessary forms in order for us  to release information regarding their care.   Patient/Guardian was advised Release of Information must be obtained prior to any record release in order to collaborate their care with an outside provider.  Patient/Guardian was advised if they have not already done so to contact the registration department to sign all necessary forms in order for us  to release information regarding their care.   Consent: Patient/Guardian gives verbal consent for treatment and assignment of benefits for services provided during this visit. Patient/Guardian expressed understanding and agreed to proceed.    Dorn Jama Der, NP 09/21/2023, 9:52 AM

## 2023-09-27 NOTE — Progress Notes (Unsigned)
 PROVIDER NOTE: Interpretation of information contained herein should be left to medically-trained personnel. Specific patient instructions are provided elsewhere under Patient Instructions section of medical record. This document was created in part using AI and STT-dictation technology, any transcriptional errors that may result from this process are unintentional.  Patient: Kaylee Mcguire  Service: E/M Encounter  Provider: Emmy MARLA Blanch, NP  DOB: [birth year]  Delivery: Face-to-face  Specialty: Interventional Pain Management  MRN: [id number]  Setting: Ambulatory outpatient facility  Specialty designation: 09  Type: New Patient  Location: Outpatient office facility  PCP: Ostwalt, Janna, PA-C  DOS: 09/28/2023    Referring Prov.: Ostwalt, Janna, PA-C   Primary Reason(s) for Visit: Encounter for initial evaluation of one or more chronic problems (new to examiner) potentially causing chronic pain, and posing a threat to normal musculoskeletal function. (Level of risk: High) CC: No chief complaint on file.  HPI  Kaylee Mcguire is a 48 y.o. year old, female patient, who comes for the first time to our practice referred by Ostwalt, Janna, PA-C for our initial evaluation of her chronic pain. She has Anxiety and depression; PTSD (post-traumatic stress disorder); Eczema; Chronic bilateral low back pain with right-sided sciatica; Other irritable bowel syndrome; Other specified attention deficit hyperactivity disorder (ADHD); Morbid obesity (HCC); Lower extremity edema; Smoking; and Primary hypertension on their problem list. Today she comes in for evaluation of her No chief complaint on file.  Pain Assessment: Location:     Radiating:   Onset:   Duration:   Quality:   Severity:  /10 (subjective, self-reported pain score)  Effect on ADL:   Timing:   Modifying factors:   BP:    HR:    Onset and Duration: {Hx; Onset and Duration:210120511} Cause of pain: {Hx; Cause:210120521} Severity: {Pain  Severity:210120502} Timing: {Symptoms; Timing:210120501} Aggravating Factors: {Causes; Aggravating pain factors:210120507} Alleviating Factors: {Causes; Alleviating Factors:210120500} Associated Problems: {Hx; Associated problems:210120515} Quality of Pain: {Hx; Symptom quality or Descriptor:210120531} Previous Examinations or Tests: {Hx; Previous examinations or test:210120529} Previous Treatments: {Hx; Previous Treatment:210120503}  Kaylee Mcguire is being evaluated for possible interventional pain management therapies for the treatment of her chronic pain.  Discussed the use of AI scribe software for clinical note transcription with the patient, who gave verbal consent to proceed.  History of Present Illness           ***  Kaylee Mcguire has been informed that this initial visit was an evaluation only.  On the follow up appointment I will go over the results, including ordered tests and available interventional therapies. At that time she will have the opportunity to decide whether to proceed with offered therapies or not. In the event that Kaylee Mcguire prefers avoiding interventional options, this will conclude our involvement in the case.  Medication management recommendations may be provided upon request.  Patient informed that diagnostic tests may be ordered to assist in identifying underlying causes, narrow the list of differential diagnoses and aid in determining candidacy for (or contraindications to) planned therapeutic interventions.  Historic Controlled Substance Pharmacotherapy Review PMP and historical list of controlled substances: ***  Most recently prescribed controlled substance(s): Opioid Analgesic: *** MME/day: *** mg/day  Historical Monitoring: The patient  reports no history of drug use. List of prior UDS Testing: No results found for: MDMA, COCAINSCRNUR, PCPSCRNUR, PCPQUANT, CANNABQUANT, THCU, ETH, CBDTHCR, D8THCCBX, D9THCCBX Historical Background  Evaluation: [place] PMP: PDMP reviewed during this encounter. {Blank single:19197::   ,Unable to conduct review of the controlled substance reporting system due to technological  failure.,Six (6) year initial data search conducted.,Review of the past 51-months conducted.} {Blank single:19197::Abnormal pattern detected.,No abnormal patterns identified.,Database search revealed no record of prior opioid analgesics.,Database search revealed no record of prior controlled substances.,Regular, uninterrupted pattern of monthly opioid refills detected.,Discrepancies found between information provided by the patient and the database.,     } {Blank single:19197::Pattern of multiple prescribers found,Pattern of multiple pharmacies found,A pattern of multiple prescribers and multiple pharmacies was identified,     } PMP NARX Score Report:  Narcotic: *** Sedative: *** Stimulant: *** [place] Department of public safety, offender search: Engineer, mining Information) {Blank single:19197::Positive for,No criminal record(s) found,Non-contributory} Risk Assessment Profile: Aberrant behavior: {Aberrant Behavior:210120800::None observed or detected today} Risk factors for fatal opioid overdose: {Risk Factors:210120801::None identified today} PMP NARX Overdose Risk Score: *** Fatal overdose hazard ratio (HR): {Blank single:19197::1.32 for 20-49 MME/day,1.92 for 50-99 MME/day,2.04 for doses equal to, or higher than 100 MME/day,Calculation deferred} Non-fatal overdose hazard ratio (HR): {Blank single:19197::1.44 for 20-49 MME/day,3.73 for 50-99 MME/day,8.87 for 100-199 MME/day,2.88 for doses equal to, or higher than 200 MME/day,Calculation deferred} Risk of opioid abuse or dependence: 0.7-3.0% with doses <= 36 MME/day and 6.1-26% with doses >= 120 MME/day. Substance use disorder (SUD) risk level: {Blank single:19197::High,Moderate,Low,Pending results of Medical Psychology  Evaluation for SUD,See ORT evaluation,See below} Personal History of Substance Abuse (SUD-Substance use disorder):  Alcohol:    Illegal Drugs:    Rx Drugs:    ORT Risk Level calculation:    ORT Scoring interpretation table:  Score <3 = Low Risk for SUD  Score between 4-7 = Moderate Risk for SUD  Score >8 = High Risk for Opioid Abuse   PHQ-2 Depression Scale:  Total score:    PHQ-2 Scoring interpretation table: (Score and probability of major depressive disorder)  Score 0 = No depression  Score 1 = 15.4% Probability  Score 2 = 21.1% Probability  Score 3 = 38.4% Probability  Score 4 = 45.5% Probability  Score 5 = 56.4% Probability  Score 6 = 78.6% Probability   PHQ-9 Depression Scale:  Total score:    PHQ-9 Scoring interpretation table:  Score 0-4 = No depression  Score 5-9 = Mild depression  Score 10-14 = Moderate depression  Score 15-19 = Moderately severe depression  Score 20-27 = Severe depression (2.4 times higher risk of SUD and 2.89 times higher risk of overuse)   Pharmacologic Plan: {Blank single:19197::None at this point.,Non-opioid analgesic therapy offered.,Further information needed.,No further testing ordered.,Adjust therapy: ***,Discontinue opioid analgesic therapy.,Continue therapy as is.,No opioid analgesics.,Kaylee Mcguire indicated having a preference to stay away from opioid analgesics.,Kaylee Mcguire did not have a Drug Screening Test done today.,As per protocol, I have not taken over any controlled substance management, pending the results of ordered tests and/or consults.} {Blank single:19197::She declined/refused having a Drug Screening test done today.,She left without providing us  with a sample for the Drug Screening Test.,Kaylee Mcguire. [place] states being against the use of opioid analgesics, as part of her treatment plan.,Kaylee Mcguire. [place] has expressed her preference to stay away from controlled substances.,Kaylee Mcguire. Mcjunkins has indicated not being  interested in our services, at this time.,          } Initial impression: {Blank single:19197::N/A,The patient indicated having no interest, at this time.,Kaylee Mcguire. Mclees indicated having no interest in opioid therapy, at this point.,Poor candidate for opioid analgesics.,High risk for opiate therapy.,No immediate contraindications found.,Pending review of available data and ordered tests.}  Meds   Current Outpatient Medications:    acetaminophen  (TYLENOL ) 500 MG tablet, Take 500  mg by mouth 3 (three) times daily., Disp: , Rfl:    albuterol  (VENTOLIN  HFA) 108 (90 Base) MCG/ACT inhaler, Inhale 2 puffs into the lungs every 6 (six) hours as needed for wheezing or shortness of breath., Disp: 18 g, Rfl: 2   cariprazine  (VRAYLAR ) 1.5 MG capsule, Take 1 capsule (1.5 mg total) by mouth daily., Disp: , Rfl:    cetirizine  (ZYRTEC ) 10 MG tablet, Take 1 tablet (10 mg total) by mouth daily., Disp: 30 tablet, Rfl: 11   diclofenac  (VOLTAREN ) 75 MG EC tablet, Take 1 tablet (75 mg total) by mouth 2 (two) times daily., Disp: 60 tablet, Rfl: 1   Dupilumab  (DUPIXENT ) 300 MG/2ML SOAJ, Inject 300 mg into the skin every 14 (fourteen) days. Starting at day 15 for maintenance., Disp: 4 mL, Rfl: 5   EPINEPHrine 0.3 mg/0.3 mL IJ SOAJ injection, as directed Injection prn for systemic reactions for 30 days, Disp: , Rfl:    fluticasone (FLONASE) 50 MCG/ACT nasal spray, Place 1 spray into both nostrils daily., Disp: , Rfl:    ipratropium (ATROVENT ) 0.03 % nasal spray, Place 2 sprays into both nostrils every 12 (twelve) hours for 3 days., Disp: 30 mL, Rfl: 0   lisinopril  (ZESTRIL ) 5 MG tablet, Take 1 tablet (5 mg total) by mouth daily., Disp: 90 tablet, Rfl: 3   methocarbamol (ROBAXIN) 500 MG tablet, Take 500 mg by mouth 4 (four) times daily., Disp: , Rfl:    Olopatadine HCl 0.2 % SOLN, 1 drop into affected eye Ophthalmic Once a day or prn for 30 days, Disp: , Rfl:    tacrolimus  (PROTOPIC ) 0.1 % ointment, Apply  topically 2 (two) times daily., Disp: 100 g, Rfl: 0   triamcinolone  cream (KENALOG ) 0.1 %, Apply 1 Application topically 2 (two) times daily., Disp: 463 g, Rfl: 0  Imaging Review   Shoulder-L DG: Results for orders placed during the hospital encounter of 10/04/16  DG Shoulder Left  Narrative CLINICAL DATA:  [place]lem[place] at work yesterday.  Left scapular pain.  EXAM: LEFT SHOULDER - 2+ VIEW  COMPARISON:  None.  FINDINGS: There is no evidence of fracture or dislocation. There is no evidence of arthropathy or other focal bone abnormality. Soft tissues are unremarkable.  IMPRESSION: Negative left radiographs.   Electronically Signed By: Lonni Necessary M.D. On: 10/04/2016 11:43  DG Lumbar Spine 2-3 Views  Narrative CLINICAL DATA:  48 year old female with acute low back pain radiating down her right leg.  EXAM: LUMBAR SPINE - 2-3 VIEW  COMPARISON:  CT Abdomen and Pelvis 04/13/2018.  FINDINGS: Normal lumbar segmentation. Mild levoconvex lumbar scoliosis with stable lumbar lordosis from last year. No acute osseous abnormality identified. Chronic disc and/or endplate degeneration L1-L2, L2-L3 and L5-S1. Visible sacrum and SI joints appear intact. Negative visible abdominal and pelvic visceral contours.  IMPRESSION: 1. No acute osseous abnormality identified in the lumbar spine. 2. Mild levoconvex lumbar scoliosis with chronic disc and endplate degeneration L1-L2, L2-L3 and L5-S1.   Electronically Signed By: VEAR Hurst M.D. On: 01/02/2020 16:17  Narrative  EXAM: XR LUMBAR SPINE 2 OR 3 VIEWS DATE: 12/14/2022 11:35 AM ACCESSION: [id number] UN DICTATED: 12/14/2022 11:55 AM INTERPRETATION LOCATION: [facility]  CLINICAL INDICATION: 48 years old Female with eval progression of lumbar spondylosis/concern for radiculopathy L5/S1  - M54.50 - Back pain, lumbosacral    COMPARISON: MRI lumbar spine 02/05/2020  TECHNIQUE: AP and lateral views of the lumbar  spine.  FINDINGS: Mild to moderate lumbar levocurvature. Sacroiliac joints are approximated with mild sclerotic  changes. No acute fracture. Vertebral heights are maintained. Intervertebral discs height loss with endplate sclerosis and osteophytosis greatest at L1-L2 and L5-S1. Minimal stepwise retrolisthesis from L1-L3. Multilevel facet arthropathy greatest in the lower lumbar spine. Soft tissues are unremarkable.  IMPRESSION:  --Moderate to severe degenerative disc disease and facet arthropathy greatest at L1-L2 and L5-S1.   Narrative  EXAM: XR HIPS BILATERAL WITH PELVIS 5 VIEWS OR MORE DATE: 12/14/2022 11:35 AM ACCESSION: [id number] UN DICTATED: 12/14/2022 2:10 PM INTERPRETATION LOCATION: [facility]  CLINICAL INDICATION: 48 years old Female with eval progression of bilateral hip OA  - M25.559 - Hip pain, unspecified laterality - M99.04 - Somatic dysfunction of right sacroiliac joint      COMPARISON: 10/27/2012 trauma hip radiograph.  TECHNIQUE:  AP and frog leg lateral views of the hips. AP view of the pelvis.  FINDINGS: Unchanged appearance of the hips. There is no joint space narrowing. There is no osteophytosis identified.  The sacroiliac joints and pubic symphysis are approximated.  IMPRESSION:  There is no evidence of osteoarthritis within the hips.   Complexity Note: Imaging results reviewed.                         ROS  Cardiovascular: {Hx; Cardiovascular History:210120525} Pulmonary or Respiratory: {Hx; Pumonary and/or Respiratory History:210120523} Neurological: {Hx; Neurological:210120504} Psychological-Psychiatric: {Hx; Psychological-Psychiatric History:210120512} Gastrointestinal: {Hx; Gastrointestinal:210120527} Genitourinary: {Hx; Genitourinary:210120506} Hematological: {Hx; Hematological:210120510} Endocrine: {Hx; Endocrine history:210120509} Rheumatologic: {Hx; Rheumatological:210120530} Musculoskeletal: {Hx; Musculoskeletal:210120528} Work History: {Hx;  Work history:210120514}  Allergies  Kaylee Mcguire is allergic to gabapentin and codeine.  Laboratory Chemistry Profile   Renal Lab Results  Component Value Date   BUN 19 07/20/2023   CREATININE 0.99 07/20/2023   BCR 19 07/20/2023   GFRAA >60 04/13/2018   GFRNONAA >60 04/13/2018   SPECGRAV >1.030 (H) 08/18/2016   PHUR 5.0 08/18/2016   PROTEINUR NEGATIVE 04/13/2018     Electrolytes Lab Results  Component Value Date   NA 140 07/20/2023   K 4.6 07/20/2023   CL 107 (H) 07/20/2023   CALCIUM 8.8 07/20/2023     Hepatic Lab Results  Component Value Date   AST 16 07/20/2023   ALT 16 07/20/2023   ALBUMIN 4.1 07/20/2023   ALKPHOS 58 07/20/2023   LIPASE 29 07/20/2016     ID Lab Results  Component Value Date   PREGTESTUR NEGATIVE 04/13/2018     Bone No results found for: VD25OH, CI874NY7UNU, CI6874NY7, CI7874NY7, 25OHVITD1, 25OHVITD2, 25OHVITD3, TESTOFREE, TESTOSTERONE   Endocrine Lab Results  Component Value Date   GLUCOSE 96 07/20/2023   GLUCOSEU NEGATIVE 04/13/2018   HGBA1C 5.5 01/27/2023   TSH 1.490 01/27/2023     Neuropathy Lab Results  Component Value Date   HGBA1C 5.5 01/27/2023     CNS No results found for: COLORCSF, APPEARCSF, RBCCOUNTCSF, WBCCSF, POLYSCSF, LYMPHSCSF, EOSCSF, PROTEINCSF, GLUCCSF, JCVIRUS, CSFOLI, IGGCSF, LABACHR, ACETBL   Inflammation (CRP: Acute  ESR: Chronic) No results found for: CRP, ESRSEDRATE, LATICACIDVEN   Rheumatology No results found for: RF, ANA, LABURIC, URICUR, LYMEIGGIGMAB, LYMEABIGMQN, HLAB27   Coagulation Lab Results  Component Value Date   PLT 325 07/20/2023     Cardiovascular Lab Results  Component Value Date   HGB 13.4 07/20/2023   HCT 40.5 07/20/2023     Screening Lab Results  Component Value Date   PREGTESTUR NEGATIVE 04/13/2018     Cancer No results found for: CEA, CA125, LABCA2   Allergens No results found for: ALMOND,  APPLE, ASPARAGUS, AVOCADO, BANANA, BARLEY,  BASIL, BAYLEAF, GREENBEAN, LIMABEAN, WHITEBEAN, BEEFIGE, REDBEET, BLUEBERRY, BROCCOLI, CABBAGE, MELON, CARROT, CASEIN, CASHEWNUT, CAULIFLOWER, CELERY     Note: {Blank single:19197::No results found under the [employer] electronic medical record,Results made available to patient.,Lab results reviewed and made available to patient.,Patient noncompliant with Lab Work orders.,Lab results reviewed and explained to patient in Layman's terms.,Lab results reviewed.}  PFSH  Drug: Kaylee Mcguire  reports no history of drug use. Alcohol:  reports no history of alcohol use. Tobacco:  reports that she has been smoking cigarettes. She has never used smokeless tobacco. Medical:  has a past medical history of ADHD, ADHD (attention deficit hyperactivity disorder), Allergy, Anxiety, Arthritis, Brain dysfunction, Depression, Eczema, Eczema, IBS (irritable bowel syndrome), Neuromuscular disorder (HCC), and PTSD (post-traumatic stress disorder). Family: family history includes Allergies in her mother; Aneurysm in her maternal grandmother and mother; Asthma in her daughter; COPD in her father and mother; Cancer in her maternal grandfather; Cataracts in her mother; Childhood respiratory disease in her daughter; Diabetes in her brother, father, and mother; Food Allergy in her mother; Heart attack in her paternal grandmother; Heart disease in her father; Heart failure in her maternal grandmother; Hypertension in her father; Hypotension in her mother; Irritable bowel syndrome in her daughter; Kidney failure in her daughter.  No past surgical history on file. Active Ambulatory Problems    Diagnosis Date Noted   Anxiety and depression 01/28/2023   PTSD (post-traumatic stress disorder) 01/28/2023   Eczema 01/28/2023   Chronic bilateral low back pain with right-sided sciatica 01/28/2023   Other irritable bowel syndrome 01/28/2023    Other specified attention deficit hyperactivity disorder (ADHD) 01/28/2023   Morbid obesity (HCC) 01/28/2023   Lower extremity edema 01/28/2023   Smoking 01/28/2023   Primary hypertension 02/25/2023   Resolved Ambulatory Problems    Diagnosis Date Noted   No Resolved Ambulatory Problems   Past Medical History:  Diagnosis Date   ADHD    ADHD (attention deficit hyperactivity disorder)    Allergy    Anxiety    Arthritis    Brain dysfunction    Depression    IBS (irritable bowel syndrome)    Neuromuscular disorder (HCC)    Constitutional Exam  General appearance: {general exam:210120802::Well nourished, well developed, and well hydrated. In no apparent acute distress} There were no vitals filed for this visit. BMI Assessment: Estimated body mass index is 42.73 kg/m as calculated from the following:   Height as of 09/21/23: 5' 3 (1.6 m).   Weight as of 09/21/23: 241 lb 3.2 oz (109.4 kg).  BMI interpretation table: BMI level Category Range association with higher incidence of chronic pain  <18 kg/m2 Underweight   18.5-24.9 kg/m2 Ideal body weight   25-29.9 kg/m2 Overweight Increased incidence by 20%  30-34.9 kg/m2 Obese (Class I) Increased incidence by 68%  35-39.9 kg/m2 Severe obesity (Class II) Increased incidence by 136%  >40 kg/m2 Extreme obesity (Class III) Increased incidence by 254%   Patient's current BMI Ideal Body weight  There is no height or weight on file to calculate BMI. Ideal body weight: 52.4 kg (115 lb 8.3 oz) Adjusted ideal body weight: 75.2 kg (165 lb 12.7 oz)   BMI Readings from Last 4 Encounters:  08/31/23 42.51 kg/m  07/19/23 42.51 kg/m  04/07/23 42.83 kg/m  02/24/23 43.22 kg/m   Wt Readings from Last 4 Encounters:  08/31/23 240 lb (108.9 kg)  07/19/23 240 lb (108.9 kg)  04/07/23 241 lb 12.8 oz (109.7 kg)  02/24/23 244 lb (110.7 kg)  Psych/Mental status: {Blank single:19197::Alert and oriented x 3. Exaggerated physical and/or psychosocial  pain behavior perceived.,Alert, oriented x 3 (person, place, & time)} {Blank single:19197::Kaylee Mcguire speech pattern and demeanor seems to suggest oversedation,     } Eyes: {Blank single:19197::Miotic (pupilary constriction) due to opiate use,Midriatic,Anisocoric,Evidence of ptosis,Pin-point pupils,PERLA} Respiratory: {Blank single:19197::Oxygen-dependent COPD,No evidence of acute respiratory distress}  Assessment  Primary Diagnosis & Pertinent Problem List: There were no encounter diagnoses.  Visit Diagnosis (New problems to examiner): No diagnosis found. Plan of Care (Initial workup plan)  Note: {Blank single:19197::     ,Kaylee Mcguire has communicated to us  that she is not interested in our services, at this time.,Kaylee Mcguire was reminded that as per protocol, today's visit has been an evaluation only. We have not taken over the patient's controlled substance management.}  Problem-specific plan: Assessment and Plan            Lab Orders  No laboratory test(s) ordered today   Imaging Orders  No imaging studies ordered today   Referral Orders  No referral(s) requested today   Procedure Orders    No procedure(s) ordered today   Pharmacotherapy (current): Medications ordered:  No orders of the defined types were placed in this encounter.  Medications administered during this visit: Jahliyah L. Shapley had no medications administered during this visit.   Analgesic Pharmacotherapy:  Opioid Analgesics: For patients currently taking or requesting to take opioid analgesics, in accordance with [place]  Medical Board Guidelines, we will assess their risks and indications for the use of these substances. After completing our evaluation, we may offer recommendations, but we no longer take patients for medication management. The prescribing physician will ultimately decide, based on his/her training and level of comfort whether to adopt any of the  recommendations, including whether or not to prescribe such medicines.  Membrane stabilizer: {Blank single:19197::Not indicated,Medically contraindicated,Tried and failed,I will not be taking over Kaylee Mcguire medication regimen,Adequate regimen,To be determined at a later time}  Muscle relaxant: {Blank single:19197::Not indicated,Medically contraindicated,Tried and failed,I will not be taking over Kaylee Mcguire medication regimen,Adequate regimen,To be determined at a later time}  NSAID: {Blank single:19197::Not indicated,Medically contraindicated,Tried and failed,I will not be taking over Kaylee Mcguire medication regimen,Adequate regimen,To be determined at a later time}  Other analgesic(s): {Blank single:19197::Not indicated,Medically contraindicated,Tried and failed,I will not be taking over Kaylee Mcguire medication regimen,Adequate regimen,To be determined at a later time}   Interventional management options: Kaylee Mcguire was informed that there is no guarantee that she would be a candidate for interventional therapies. The decision will be based on the results of diagnostic studies, as well as Kaylee Mcguire. [place]'s risk profile.  Procedure(s) under consideration:  {Blank single:19197::See below,Pending results of ordered studies}     Interventional Therapies  Risk Factors  Considerations  Medical Comorbidities:     Planned  Pending:      Under consideration:   Pending   Completed: (Analgesic benefit)1  None at this time   Therapeutic  Palliative (PRN) options:   None established   Completed by other providers:   None reported  1(Analgesic benefit): Expressed in percentage (%). (Local anesthetic[LA] +/- sedation  L.A.Local Anesthetic  Steroid benefit  Ongoing benefit)   Provider-requested follow-up: No follow-ups on file.  Future Appointments  Date Time Provider Department Center  09/28/2023  2:00 PM Tobie Emmy POUR, NP [facility] None  10/05/2023  11:00 AM Saucier, Dorn Ruth, NP [facility] None  10/12/2023  1:20 PM Ostwalt, Janna, PA-C [facility]  09/11/2024  2:00  PM Claudene Lehmann, MD [facility] None   I discussed the assessment and treatment plan with the patient. The patient was provided an opportunity to ask questions and all were answered. The patient agreed with the plan and demonstrated an understanding of the instructions.  Patient advised to call back or seek an in-person evaluation if the symptoms or condition worsens.  Duration of encounter: *** minutes.  Total time on encounter, as per AMA guidelines included both the face-to-face and non-face-to-face time personally spent by the physician and/or other qualified health care professional(s) on the day of the encounter (includes time in activities that require the physician or other qualified health care professional and does not include time in activities normally performed by clinical staff). Physician's time may include the following activities when performed: Preparing to see the patient (e.g., pre-charting review of records, searching for previously ordered imaging, lab work, and nerve conduction tests) Review of prior analgesic pharmacotherapies. Reviewing PMP Interpreting ordered tests (e.g., lab work, imaging, nerve conduction tests) Performing post-procedure evaluations, including interpretation of diagnostic procedures Obtaining and/or reviewing separately obtained history Performing a medically appropriate examination and/or evaluation Counseling and educating the patient/family/caregiver Ordering medications, tests, or procedures Referring and communicating with other health care professionals (when not separately reported) Documenting clinical information in the electronic or other health record Independently interpreting results (not separately reported) and communicating results to the patient/ family/caregiver Care coordination (not separately  reported)  Note by: Liliann File K Cristen Bredeson, NP (TTS and AI technology used. I apologize for any typographical errors that were not detected and corrected.) Date: 09/28/2023; Time: 9:37 AM

## 2023-09-28 ENCOUNTER — Ambulatory Visit: Attending: Nurse Practitioner | Admitting: Nurse Practitioner

## 2023-09-28 ENCOUNTER — Encounter: Payer: Self-pay | Admitting: Nurse Practitioner

## 2023-09-28 VITALS — BP 152/88 | HR 80 | Temp 98.0°F | Resp 16 | Ht 63.0 in | Wt 241.0 lb

## 2023-09-28 DIAGNOSIS — M47816 Spondylosis without myelopathy or radiculopathy, lumbar region: Secondary | ICD-10-CM | POA: Insufficient documentation

## 2023-09-28 DIAGNOSIS — K588 Other irritable bowel syndrome: Secondary | ICD-10-CM | POA: Diagnosis not present

## 2023-09-28 DIAGNOSIS — G894 Chronic pain syndrome: Secondary | ICD-10-CM | POA: Diagnosis not present

## 2023-09-28 DIAGNOSIS — M5441 Lumbago with sciatica, right side: Secondary | ICD-10-CM | POA: Diagnosis not present

## 2023-09-28 DIAGNOSIS — G8929 Other chronic pain: Secondary | ICD-10-CM | POA: Diagnosis not present

## 2023-09-28 DIAGNOSIS — Z6841 Body Mass Index (BMI) 40.0 and over, adult: Secondary | ICD-10-CM | POA: Diagnosis not present

## 2023-09-28 DIAGNOSIS — F419 Anxiety disorder, unspecified: Secondary | ICD-10-CM | POA: Insufficient documentation

## 2023-09-28 DIAGNOSIS — F32A Depression, unspecified: Secondary | ICD-10-CM | POA: Insufficient documentation

## 2023-09-28 MED ORDER — DICLOFENAC SODIUM 75 MG PO TBEC: 75.0000 mg | DELAYED_RELEASE_TABLET | Freq: Two times a day (BID) | ORAL | 1 refills | Status: DC

## 2023-09-28 NOTE — Progress Notes (Signed)
 Safety precautions to be maintained throughout the outpatient stay will include: orient to surroundings, keep bed in low position, maintain call bell within reach at all times, provide assistance with transfer out of bed and ambulation.

## 2023-09-28 NOTE — Patient Instructions (Signed)

## 2023-09-29 ENCOUNTER — Ambulatory Visit: Admitting: Physician Assistant

## 2023-10-05 ENCOUNTER — Other Ambulatory Visit: Payer: Self-pay

## 2023-10-05 ENCOUNTER — Encounter: Payer: Self-pay | Admitting: Psychiatry

## 2023-10-05 ENCOUNTER — Ambulatory Visit (INDEPENDENT_AMBULATORY_CARE_PROVIDER_SITE_OTHER): Admitting: Psychiatry

## 2023-10-05 VITALS — BP 138/85 | HR 73 | Temp 97.6°F | Ht 63.0 in | Wt 239.2 lb

## 2023-10-05 DIAGNOSIS — F4321 Adjustment disorder with depressed mood: Secondary | ICD-10-CM

## 2023-10-05 DIAGNOSIS — F411 Generalized anxiety disorder: Secondary | ICD-10-CM

## 2023-10-05 DIAGNOSIS — F314 Bipolar disorder, current episode depressed, severe, without psychotic features: Secondary | ICD-10-CM

## 2023-10-05 MED ORDER — LUMATEPERONE TOSYLATE 42 MG PO CAPS: 42.0000 mg | ORAL_CAPSULE | Freq: Every day | ORAL | 0 refills | Status: DC

## 2023-10-05 NOTE — Progress Notes (Signed)
 [facility] MD/PA/NP OP Progress Note  10/05/2023 11:08 AM Kaylee Mcguire  MRN:  [id number]  Chief Complaint:  Chief Complaint  Patient presents with   Follow-up   HPI: 48 year old female presenting to [facility] for follow-up.  Patient comes in stating that she is feeling a little distressed as she has been having communication problems with her partner in which there is jealousy and as well as accusations regarding her work and other coworkers.  Patient reports that she was able to get clear of the eviction worries from the last week in which she still reports that she was able to prove that she is trying to make payments on her home and is able to provide proof in regards of her restrictions and being able to provide more and states that she is putting in good faith effort to try to catch up on her rent.  She reports that she did present before a judge in which the judge was ruled in her favor stating that she is putting every effort to transition jobs and is attempting to pay off her debt with apartment complex.  Patient reports that she is relieved that she will not be forced to affect.  The patient reports that she has gone with the apartment complex as is able to come up with a plan to catch up on payments.  Patient also reports that the Vraylar  that she was taking did not do well stating that it was causing a significant amount of nausea and she would like to stop.  Based on this assessment interview is recommended for the patient to start on Caplyta  42 mg once a day due to Vraylar  being a failure.  Brillar has been sent to pharmacy in which patient was asked to wait for the prescription.  Patient has been provided a 14-day sample in the meantime.  Patient currently is endorsing feelings of anxiety and depression due to the fact that her partner feels that she is not being faithful to him though she is working well at her new job.  Patient works at the [employer] and states that she has already been promoted  to a higher position which she is really proud of for herself.  Patient states that based on her performance she is getting continuing to work hard and she is satisfied that she is getting a lot of hours and has no concerns about having to make enough money to pay for her apartment now.  This time patient has also been referred [facility] by this provider.  The patient is to wait for a call from professional to schedule appointment.  Patient is in agreement with treatment plan.  Patient with no other questions or concerns at this time.  Patient denies SI, HI, AVH.  Patient will follow up in 2 weeks. Visit Diagnosis:    ICD-10-CM   1. Bipolar disorder, current episode depressed, severe, without psychotic features (HCC)  F31.4     2. Anxiety state  F41.1     3. 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       Past Psychiatric History:  Previous Psych Hospitalizations: - Denies Outpatient treatment:  - Current PCP is Kaylee Melody, PA-C Medications Current: - Caplyta  42mg  once daily Next Steps: - Evaluate with effectiveness of Vraylar  and monitor symptoms of possible hypomania Medication Trials: - Xanax, side effects and poor response - Abilify, side effects - 10/05/2023 Vraylar  1.5 mg once daily - 10/05/23 Vraylar  1.5 mg once daily, side effects  of nausea Suicide & Violence: - Currently denies SI, HI, AVH Substance Use: - Denies Psychotherapy: - Patient recommended for [facility].  Legal:  - Denies  Past Medical History:  Past Medical History:  Diagnosis Date   ADHD    ADHD (attention deficit hyperactivity disorder)    Allergy    Anxiety    Arthritis    Brain dysfunction    Depression    Eczema    Eczema    IBS (irritable bowel syndrome)    Neuromuscular disorder (HCC)    PTSD (post-traumatic stress disorder)    History reviewed. No pertinent surgical history.  Family Psychiatric History: No additional  Family History:  Family History   Problem Relation Age of Onset   Aneurysm Mother    Diabetes Mother    Hypotension Mother    Allergies Mother    Food Allergy Mother    Cataracts Mother    COPD Mother    Heart disease Father    Diabetes Father    COPD Father    Hypertension Father    Diabetes Brother    Asthma Daughter    Kidney failure Daughter    Irritable bowel syndrome Daughter    Childhood respiratory disease Daughter    Aneurysm Maternal Grandmother    Heart failure Maternal Grandmother    Cancer Maternal Grandfather    Heart attack Paternal Grandmother    Bladder Cancer Neg Hx    Kidney cancer Neg Hx     Social History:  Social History   Socioeconomic History   Marital status: Significant Other    Spouse name: Not on file   Number of children: 2   Years of education: Not on file   Highest education level: Some college, no degree  Occupational History   Not on file  Tobacco Use   Smoking status: Every Day    Current packs/day: 0.50    Types: Cigarettes   Smokeless tobacco: Never  Vaping Use   Vaping status: Never Used  Substance and Sexual Activity   Alcohol use: Yes   Drug use: No   Sexual activity: Yes    Birth control/protection: None  Other Topics Concern   Not on file  Social History Narrative   Not on file   Social Drivers of Health   Financial Resource Strain: High Risk (04/07/2023)   Overall Financial Resource Strain (CARDIA)    Difficulty of Paying Living Expenses: Hard  Food Insecurity: Food Insecurity Present (04/07/2023)   Hunger Vital Sign    Worried About Running Out of Food in the Last Year: Often true    Ran Out of Food in the Last Year: Often true  Transportation Needs: No Transportation Needs (04/07/2023)   PRAPARE - Administrator, Civil Service (Medical): No    Lack of Transportation (Non-Medical): No  Physical Activity: Insufficiently Active (04/07/2023)   Exercise Vital Sign    Days of Exercise per Week: 5 days    Minutes of Exercise per  Session: 10 min  Stress: Stress Concern Present (04/07/2023)   [employer] of Occupational Health - Occupational Stress Questionnaire    Feeling of Stress : Rather much  Social Connections: Moderately Integrated (04/07/2023)   Social Connection and Isolation Panel    Frequency of Communication with Friends and Family: More than three times a week    Frequency of Social Gatherings with Friends and Family: More than three times a week    Attends Religious Services: More than 4  times per year    Active Member of Clubs or Organizations: No    Attends Banker Meetings: Not on file    Marital Status: Living with partner    Allergies:  Allergies  Allergen Reactions   Gabapentin Hives   Codeine     Metabolic Disorder Labs: Lab Results  Component Value Date   HGBA1C 5.5 01/27/2023   No results found for: PROLACTIN Lab Results  Component Value Date   CHOL 156 07/20/2023   TRIG 97 07/20/2023   HDL 45 07/20/2023   CHOLHDL 3.5 07/20/2023   LDLCALC 93 07/20/2023   LDLCALC 70 01/27/2023   Lab Results  Component Value Date   TSH 1.490 01/27/2023   TSH 1.24 04/18/2013    Therapeutic Level Labs: No results found for: LITHIUM No results found for: VALPROATE No results found for: CBMZ  Current Medications: Current Outpatient Medications  Medication Sig Dispense Refill   acetaminophen  (TYLENOL ) 500 MG tablet Take 500 mg by mouth 3 (three) times daily.     albuterol  (VENTOLIN  HFA) 108 (90 Base) MCG/ACT inhaler Inhale 2 puffs into the lungs every 6 (six) hours as needed for wheezing or shortness of breath. 18 g 2   cariprazine  (VRAYLAR ) 1.5 MG capsule Take 1 capsule (1.5 mg total) by mouth daily.     cetirizine  (ZYRTEC ) 10 MG tablet Take 1 tablet (10 mg total) by mouth daily. 30 tablet 11   diclofenac  (VOLTAREN ) 75 MG EC tablet Take 1 tablet (75 mg total) by mouth 2 (two) times daily. 60 tablet 1   Dupilumab  (DUPIXENT ) 300 MG/2ML SOAJ Inject 300 mg into the  skin every 14 (fourteen) days. Starting at day 15 for maintenance. 4 mL 5   EPINEPHrine 0.3 mg/0.3 mL IJ SOAJ injection as directed Injection prn for systemic reactions for 30 days     fluticasone (FLONASE) 50 MCG/ACT nasal spray Place 1 spray into both nostrils daily.     ipratropium (ATROVENT ) 0.03 % nasal spray Place 2 sprays into both nostrils every 12 (twelve) hours for 3 days. 30 mL 0   lisinopril  (ZESTRIL ) 5 MG tablet Take 1 tablet (5 mg total) by mouth daily. 90 tablet 3   methocarbamol (ROBAXIN) 500 MG tablet Take 500 mg by mouth 4 (four) times daily.     Olopatadine HCl 0.2 % SOLN 1 drop into affected eye Ophthalmic Once a day or prn for 30 days     tacrolimus  (PROTOPIC ) 0.1 % ointment Apply topically 2 (two) times daily. 100 g 0   triamcinolone  cream (KENALOG ) 0.1 % Apply 1 Application topically 2 (two) times daily. 463 g 0   No current facility-administered medications for this visit.     Musculoskeletal: Strength & Muscle Tone: within normal limits Gait & Station: normal Patient leans: N/A  Psychiatric Specialty Exam: Review of Systems  Constitutional: Negative.   HENT: Negative.    Eyes: Negative.   Respiratory: Negative.    Cardiovascular: Negative.   Gastrointestinal: Negative.   Endocrine: Negative.   Genitourinary: Negative.   Musculoskeletal: Negative.   Skin: Negative.   Allergic/Immunologic: Negative.   Neurological: Negative.   Hematological: Negative.   Psychiatric/Behavioral:  Positive for dysphoric mood. The patient is nervous/anxious.     There were no vitals taken for this visit.There is no height or weight on file to calculate BMI.  General Appearance: Well Groomed  Eye Contact:  Good  Speech:  Clear and Coherent  Volume:  Normal  Mood:  Anxious and Depressed  Affect:  Depressed  Thought Process:  Coherent  Orientation:  Full (Time, Place, and Person)  Thought Content: Logical   Suicidal Thoughts:  No  Homicidal Thoughts:  No  Memory:   Immediate;   Good Recent;   Good Remote;   Good  Judgement:  Good  Insight:  Good  Psychomotor Activity:  Normal  Concentration:  Concentration: Good and Attention Span: Good  Recall:  Good  Fund of Knowledge: Good  Language: Good  Akathisia:  No  Handed:  Right  AIMS (if indicated):   Assets:  Desire for Improvement Financial Resources/Insurance Housing  ADL's:  Intact  Cognition: WNL  Sleep:  Good   Screenings: GAD-7    Flowsheet Row Office Visit from 09/07/2023 in [facility] Office Visit from 08/31/2023 in [facility] Office Visit from 07/19/2023 in [facility] Office Visit from 04/07/2023 in [facility] Office Visit from 02/24/2023 in [facility]  Total GAD-7 Score 14 13 7 12 14    PHQ2-9    Flowsheet Row Office Visit from 09/28/2023 in [facility] Visit from 09/07/2023 in [facility] Office Visit from 08/31/2023 in [facility] Office Visit from 07/19/2023 in [facility] Office Visit from 04/07/2023 in [facility]  PHQ-2 Total Score 3 3 4 2 2   PHQ-9 Total Score 11 13 16 7 9      Assessment and Plan:  Assessment - Diagnosis: Bipolar disorder, current episode depressed, severe, without psychotic features (HCC) [F31.4]  2. Anxiety state [F41.1]  3. Complicated grieving [F43.21]  - Progress: Baseline appointment - Risk Factors: Worsening symptoms, Manic behaviors  Plan - Medications:  Stop Vraylar   - Psychotherapy: Referred to [facility] for trauma therapy.  - Education: Patient has been educated on how to contact this provider through my chart messaging or by calling the clinic.  Patient also educated on how to when to expect and reply  and to give up to 24 hours for the provider to respond to messages sent to the clinic.  Patient has been educated on medications, usage, side effects and adverse reactions. - Follow-Up: Patient will follow up in 2 weeks in person - Referrals: Patient has been referred to [facility] for trauma therapy. - Safety Planning:  The patient has been educated, if they should have suicidal thoughts with or without a plan to call 911, or go to the closest emergency department.  Pt verbalized understanding.  Pt denies firearms within the home.  Pt also agrees to call the clinic should they have worsening symptoms before the next appointment.    Patient/Guardian was advised Release of Information must be obtained prior to any record release in order to collaborate their care with an outside provider. Patient/Guardian was advised if they have not already done so to contact the registration department to sign all necessary forms in order for us  to release information regarding their care.   Consent: Patient/Guardian gives verbal consent for treatment and assignment of benefits for services provided during this visit. Patient/Guardian expressed understanding and agreed to proceed.    Dorn Jama Der, NP 10/05/2023, 11:08 AM

## 2023-10-12 ENCOUNTER — Ambulatory Visit: Admitting: Physician Assistant

## 2023-10-14 DIAGNOSIS — G5602 Carpal tunnel syndrome, left upper limb: Secondary | ICD-10-CM | POA: Diagnosis not present

## 2023-10-19 ENCOUNTER — Other Ambulatory Visit: Payer: Self-pay

## 2023-10-19 ENCOUNTER — Encounter: Payer: Self-pay | Admitting: Psychiatry

## 2023-10-19 ENCOUNTER — Ambulatory Visit (INDEPENDENT_AMBULATORY_CARE_PROVIDER_SITE_OTHER): Admitting: Psychiatry

## 2023-10-19 VITALS — BP 147/93 | HR 77 | Temp 98.1°F | Ht 63.0 in | Wt 243.0 lb

## 2023-10-19 DIAGNOSIS — F314 Bipolar disorder, current episode depressed, severe, without psychotic features: Secondary | ICD-10-CM | POA: Diagnosis not present

## 2023-10-19 DIAGNOSIS — F319 Bipolar disorder, unspecified: Secondary | ICD-10-CM | POA: Diagnosis not present

## 2023-10-19 DIAGNOSIS — F411 Generalized anxiety disorder: Secondary | ICD-10-CM

## 2023-10-19 DIAGNOSIS — F4321 Adjustment disorder with depressed mood: Secondary | ICD-10-CM

## 2023-10-19 DIAGNOSIS — F4310 Post-traumatic stress disorder, unspecified: Secondary | ICD-10-CM | POA: Diagnosis not present

## 2023-10-19 NOTE — Progress Notes (Signed)
 [facility] MD/PA/NP OP Progress Note  10/19/2023 11:22 AM Kaylee Mcguire  MRN:  [id number]  Chief Complaint:  Chief Complaint  Patient presents with   Follow-up   HPI: 48 year old female female presenting [facility] for follow-up.  Patient reports that she had an argument with her husband today that that is making her feel very emotional.  Patient was is not sure if it is the heat but states that her husband is acting out of the norm accusing her of having to look through Facebook when she was trying to pull up her appointment with her therapist.  Patient reports that she has been having a lot of stress related to this relationship as he is out of work and seems to be accusing her of a lot of activities that is not true.  Patient also reports that she is doing well at work stating that she is able to do well at work but states that she did experience of burn after handling boiling water in which scolded her after she drops and veggies into a big pot.  Patient reports that she is doing well she was a little late to the appointment today looking distressed as she had to pick up food for her little boy who she is watching over which is her daughter's son.  Patient with no other questions or concerns at this time patient states that the medication is going appropriately states that she is actually getting 7 to 8 hours of sleep.  No recommendation of changes to the medications.  Patient will continue as scheduled.  Patient denies SI, HI, AVH.  Patient will is in agreement with treatment plan.  Patient will follow up in 2 weeks.  Visit Diagnosis:    ICD-10-CM   1. Bipolar disorder, current episode depressed, severe, without psychotic features (HCC)  F31.4     2. Anxiety state  F41.1     3. 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       Past Psychiatric History:  Previous Psych Hospitalizations: - Denies Outpatient treatment:  - Current PCP is Benedict Melody, PA-C Medications Current: - Caplyta  42mg  once daily Next  Steps: - Evaluate with effectiveness of Vraylar  and monitor symptoms of possible hypomania Medication Trials: - Xanax, side effects and poor response - Abilify, side effects - 10/05/2023 Vraylar  1.5 mg once daily - 10/05/23 Vraylar  1.5 mg once daily, side effects of nausea Suicide & Violence: - Currently denies SI, HI, AVH Substance Use: - Denies Psychotherapy: - [facility]  Legal:  - Denies  Past Medical History:  Past Medical History:  Diagnosis Date   ADHD    ADHD (attention deficit hyperactivity disorder)    Allergy    Anxiety    Arthritis    Brain dysfunction    Depression    Eczema    Eczema    IBS (irritable bowel syndrome)    Neuromuscular disorder (HCC)    PTSD (post-traumatic stress disorder)    History reviewed. No pertinent surgical history.  Family Psychiatric History: No additional  Family History:  Family History  Problem Relation Age of Onset   Aneurysm Mother    Diabetes Mother    Hypotension Mother    Allergies Mother    Food Allergy Mother    Cataracts Mother    COPD Mother    Heart disease Father    Diabetes Father    COPD Father    Hypertension Father    Diabetes Brother    Asthma Daughter  Kidney failure Daughter    Irritable bowel syndrome Daughter    Childhood respiratory disease Daughter    Aneurysm Maternal Grandmother    Heart failure Maternal Grandmother    Cancer Maternal Grandfather    Heart attack Paternal Grandmother    Bladder Cancer Neg Hx    Kidney cancer Neg Hx     Social History:  Social History   Socioeconomic History   Marital status: Significant Other    Spouse name: Not on file   Number of children: 2   Years of education: Not on file   Highest education level: Some college, no degree  Occupational History   Not on file  Tobacco Use   Smoking status: Every Day    Current packs/day: 0.50    Types: Cigarettes   Smokeless tobacco: Never  Vaping Use   Vaping  status: Never Used  Substance and Sexual Activity   Alcohol use: Yes   Drug use: No   Sexual activity: Yes    Birth control/protection: None  Other Topics Concern   Not on file  Social History Narrative   Not on file   Social Drivers of Health   Financial Resource Strain: High Risk (04/07/2023)   Overall Financial Resource Strain (CARDIA)    Difficulty of Paying Living Expenses: Hard  Food Insecurity: Food Insecurity Present (04/07/2023)   Hunger Vital Sign    Worried About Running Out of Food in the Last Year: Often true    Ran Out of Food in the Last Year: Often true  Transportation Needs: No Transportation Needs (04/07/2023)   PRAPARE - Administrator, Civil Service (Medical): No    Lack of Transportation (Non-Medical): No  Physical Activity: Insufficiently Active (04/07/2023)   Exercise Vital Sign    Days of Exercise per Week: 5 days    Minutes of Exercise per Session: 10 min  Stress: Stress Concern Present (04/07/2023)   [employer] of Occupational Health - Occupational Stress Questionnaire    Feeling of Stress : Rather much  Social Connections: Moderately Integrated (04/07/2023)   Social Connection and Isolation Panel    Frequency of Communication with Friends and Family: More than three times a week    Frequency of Social Gatherings with Friends and Family: More than three times a week    Attends Religious Services: More than 4 times per year    Active Member of [employer] or Organizations: No    Attends Banker Meetings: Not on file    Marital Status: Living with partner    Allergies:  Allergies  Allergen Reactions   Gabapentin Hives   Codeine     Metabolic Disorder Labs: Lab Results  Component Value Date   HGBA1C 5.5 01/27/2023   No results found for: PROLACTIN Lab Results  Component Value Date   CHOL 156 07/20/2023   TRIG 97 07/20/2023   HDL 45 07/20/2023   CHOLHDL 3.5 07/20/2023   LDLCALC 93 07/20/2023   LDLCALC 70  01/27/2023   Lab Results  Component Value Date   TSH 1.490 01/27/2023   TSH 1.24 04/18/2013    Therapeutic Level Labs: No results found for: LITHIUM No results found for: VALPROATE No results found for: CBMZ  Current Medications: Current Outpatient Medications  Medication Sig Dispense Refill   acetaminophen  (TYLENOL ) 500 MG tablet Take 500 mg by mouth 3 (three) times daily.     albuterol  (VENTOLIN  HFA) 108 (90 Base) MCG/ACT inhaler Inhale 2 puffs into the lungs every 6 (  six) hours as needed for wheezing or shortness of breath. 18 g 2   cariprazine  (VRAYLAR ) 1.5 MG capsule Take 1 capsule (1.5 mg total) by mouth daily.     cetirizine  (ZYRTEC ) 10 MG tablet Take 1 tablet (10 mg total) by mouth daily. 30 tablet 11   diclofenac  (VOLTAREN ) 75 MG EC tablet Take 1 tablet (75 mg total) by mouth 2 (two) times daily. 60 tablet 1   Dupilumab  (DUPIXENT ) 300 MG/2ML SOAJ Inject 300 mg into the skin every 14 (fourteen) days. Starting at day 15 for maintenance. 4 mL 5   EPINEPHrine 0.3 mg/0.3 mL IJ SOAJ injection as directed Injection prn for systemic reactions for 30 days     fluticasone (FLONASE) 50 MCG/ACT nasal spray Place 1 spray into both nostrils daily.     ipratropium (ATROVENT ) 0.03 % nasal spray Place 2 sprays into both nostrils every 12 (twelve) hours for 3 days. 30 mL 0   lisinopril  (ZESTRIL ) 5 MG tablet Take 1 tablet (5 mg total) by mouth daily. 90 tablet 3   lumateperone  tosylate (CAPLYTA ) 42 MG capsule Take 1 capsule (42 mg total) by mouth daily. 30 capsule 0   methocarbamol (ROBAXIN) 500 MG tablet Take 500 mg by mouth 4 (four) times daily.     Olopatadine HCl 0.2 % SOLN 1 drop into affected eye Ophthalmic Once a day or prn for 30 days     tacrolimus  (PROTOPIC ) 0.1 % ointment Apply topically 2 (two) times daily. 100 g 0   triamcinolone  cream (KENALOG ) 0.1 % Apply 1 Application topically 2 (two) times daily. 463 g 0   No current facility-administered medications for this visit.      Musculoskeletal: Strength & Muscle Tone: within normal limits Gait & Station: normal Patient leans: N/A  Psychiatric Specialty Exam: Review of Systems  Constitutional: Negative.   HENT: Negative.    Eyes: Negative.   Respiratory: Negative.    Cardiovascular: Negative.   Gastrointestinal: Negative.   Endocrine: Negative.   Genitourinary: Negative.   Musculoskeletal: Negative.   Skin: Negative.   Allergic/Immunologic: Negative.   Neurological: Negative.   Hematological: Negative.   Psychiatric/Behavioral:  Positive for dysphoric mood. The patient is nervous/anxious.     Blood pressure (!) 147/93, pulse 77, temperature 98.1 F (36.7 C), temperature source Temporal, height 5' 3 (1.6 m), weight 243 lb (110.2 kg).Body mass index is 43.05 kg/m.  General Appearance: Well Groomed  Eye Contact:  Good  Speech:  Clear and Coherent  Volume:  Normal  Mood:  Anxious and Depressed  Affect:  Appropriate  Thought Process:  Coherent  Orientation:  Full (Time, Place, and Person)  Thought Content: Logical   Suicidal Thoughts:  No  Homicidal Thoughts:  No  Memory:  Immediate;   Good Recent;   Good Remote;   Good  Judgement:  Good  Insight:  Good  Psychomotor Activity:  Normal  Concentration:  Concentration: Good and Attention Span: Good  Recall:  Good  Fund of Knowledge: Good  Language: Good  Akathisia:  No  Handed:  Right  AIMS (if indicated): not done  Assets:  Desire for Improvement Financial Resources/Insurance Housing  ADL's:  Intact  Cognition: WNL  Sleep:  Good   Screenings: GAD-7    Flowsheet Row Office Visit from 09/07/2023 in [facility] Office Visit from 08/31/2023 in [facility] Office Visit from 07/19/2023 in [facility] Office Visit from 04/07/2023 in [facility]  Office Visit from 02/24/2023 in [facility]  Total  GAD-7 Score 14 13 7 12 14    PHQ2-9    Flowsheet Row Office Visit from 09/28/2023 in [facility] Visit from 09/07/2023 in [facility] Office Visit from 08/31/2023 in [facility] Office Visit from 07/19/2023 in [facility] Office Visit from 04/07/2023 in [facility]  PHQ-2 Total Score 3 3 4 2 2   PHQ-9 Total Score 11 13 16 7 9      Assessment and Plan:  Assessment - Diagnosis: Bipolar disorder, current episode depressed, severe, without psychotic features (HCC) [F31.4]  2. Anxiety state [F41.1]  3. 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 - Psychotherapy: Referred to [facility] for [facility] therapy.  - Education: Patient has been educated on how to contact this provider through my chart messaging or by calling the clinic.  Patient also educated on how to when to expect and reply and to give up to 24 hours for the provider to respond to messages sent to the clinic.  Patient has been educated on medications, usage, side effects and adverse reactions. - Follow-Up: Patient will follow up in 2 weeks in person - Referrals: Patient has been referred to [facility] for trauma therapy. - Safety Planning:  The patient has been educated, if they should have suicidal thoughts with or without a plan to call 911, or go to the closest emergency department.  Pt verbalized understanding.  Pt denies firearms within the home.  Pt also agrees to call the clinic should they have worsening sym  Patient/Guardian was advised Release of Information must be obtained prior to any record release in order to collaborate their care with an outside provider. Patient/Guardian was advised if they have  not already done so to contact the registration department to sign all necessary forms in order for us  to release information regarding their care.   Consent: Patient/Guardian gives verbal consent for treatment and assignment of benefits for services provided during this visit. Patient/Guardian expressed understanding and agreed to proceed.    Dorn Jama Der, NP 10/19/2023, 11:22 AM

## 2023-10-26 ENCOUNTER — Encounter: Payer: Self-pay | Admitting: Physician Assistant

## 2023-10-26 ENCOUNTER — Ambulatory Visit: Admitting: Physician Assistant

## 2023-10-26 VITALS — BP 133/87 | HR 72 | Resp 16 | Ht 63.0 in | Wt 243.2 lb

## 2023-10-26 DIAGNOSIS — G894 Chronic pain syndrome: Secondary | ICD-10-CM | POA: Diagnosis not present

## 2023-10-26 DIAGNOSIS — E781 Pure hyperglyceridemia: Secondary | ICD-10-CM | POA: Diagnosis not present

## 2023-10-26 DIAGNOSIS — G5603 Carpal tunnel syndrome, bilateral upper limbs: Secondary | ICD-10-CM

## 2023-10-26 DIAGNOSIS — I10 Essential (primary) hypertension: Secondary | ICD-10-CM | POA: Diagnosis not present

## 2023-10-26 DIAGNOSIS — J42 Unspecified chronic bronchitis: Secondary | ICD-10-CM | POA: Diagnosis not present

## 2023-10-26 DIAGNOSIS — Z720 Tobacco use: Secondary | ICD-10-CM

## 2023-10-26 DIAGNOSIS — Z23 Encounter for immunization: Secondary | ICD-10-CM | POA: Diagnosis not present

## 2023-10-26 DIAGNOSIS — F32A Depression, unspecified: Secondary | ICD-10-CM | POA: Diagnosis not present

## 2023-10-26 DIAGNOSIS — F4310 Post-traumatic stress disorder, unspecified: Secondary | ICD-10-CM | POA: Diagnosis not present

## 2023-10-26 DIAGNOSIS — F419 Anxiety disorder, unspecified: Secondary | ICD-10-CM

## 2023-10-26 DIAGNOSIS — F319 Bipolar disorder, unspecified: Secondary | ICD-10-CM | POA: Diagnosis not present

## 2023-10-26 DIAGNOSIS — Z139 Encounter for screening, unspecified: Secondary | ICD-10-CM

## 2023-10-26 MED ORDER — FLUTICASONE-SALMETEROL 100-50 MCG/ACT IN AEPB: 1.0000 | INHALATION_SPRAY | Freq: Two times a day (BID) | RESPIRATORY_TRACT | 1 refills | Status: AC

## 2023-10-26 NOTE — Progress Notes (Signed)
 Established patient visit  Patient: Kaylee Mcguire   DOB: 1976/01/14   48 y.o. Female  MRN: [id number] Visit Date: 10/26/2023  Today's healthcare provider: Jolynn Spencer, PA-C   Chief Complaint  Patient presents with   Follow-up    6 week f/u from 08/31/23 Would like a referral Ortho with Dr. Anthonette. Was diagnosed with Carpal tunnel.   Subjective     HPI     Follow-up    Additional comments: 6 week f/u from 08/31/23 Would like a referral Ortho with Dr. Anthonette. Was diagnosed with Carpal tunnel.      Last edited by Wilfred Hargis RAMAN, CMA on 10/26/2023  3:13 PM.       Discussed the use of AI scribe software for clinical note transcription with the patient, who gave verbal consent to proceed.  History of Present Illness Kaylee Mcguire is a 48 year old female with PTSD and anxiety who presents with exacerbation of PTSD symptoms following a recent dog attack.  She experienced a dog attack at 3:00 AM on Sunday while leaving her house for work, which triggered her PTSD. The dogs were from a drug dealer's house, and police were involved. This incident caused her to be an hour late for work, which is unusual for her.  She is undergoing therapy for anxiety and depression, attending virtual sessions and seeing her psychiatrist bi-weekly. She recently started Caplyta . She takes lisinopril  for hypertension and is concerned about her refills, with only three pills left and one refill remaining.  She experiences sinus drainage and wakes up with a sore throat. She uses Ventolin  for respiratory issues. She is sleep-deprived, going to bed at 7:00 PM and waking up at 2:30 AM for work, trying to catch up on sleep on her day off.  She has experienced weight fluctuations, having lost weight to 238 pounds but has since gained it back. She often skips dinner due to her work schedule and sleep patterns. She is trying to balance her diet and is looking for a new house to move out of her current apartment.  She  has carpal tunnel syndrome and is considering surgery, as her mother had successful surgery recently. She uses a brace at night and alternates between hot and cold baths to manage inflammation and pain. She works as a Merchandiser, retail, which involves significant hand use.       10/26/2023    3:39 PM 09/28/2023    2:16 PM 09/08/2023    3:44 PM  Depression screen PHQ 2/9  Decreased Interest 1 1   Down, Depressed, Hopeless 1 2   PHQ - 2 Score 2 3   Altered sleeping 1 1   Tired, decreased energy 1 2   Change in appetite 1 1   Feeling bad or failure about yourself  1 2   Trouble concentrating 1 2   Moving slowly or fidgety/restless 0 0   Suicidal thoughts 0 0   PHQ-9 Score 7 11   Difficult doing work/chores Somewhat difficult Somewhat difficult      Information is confidential and restricted. Go to Review Flowsheets to unlock data.      10/26/2023    3:39 PM 09/08/2023    3:45 PM 08/31/2023    2:12 PM 07/19/2023    3:21 PM  GAD 7 : Generalized Anxiety Score  Nervous, Anxious, on Edge 1  2 1   Control/stop worrying 1  1 1   Worry too much - different things 1  2 1   Trouble  relaxing 1  2 1   Restless 1  2 1   Easily annoyed or irritable 1  2 1   Afraid - awful might happen 1  2 1   Total GAD 7 Score 7  13 7   Anxiety Difficulty Somewhat difficult  Somewhat difficult Somewhat difficult     Information is confidential and restricted. Go to Review Flowsheets to unlock data.    Medications: Outpatient Medications Prior to Visit  Medication Sig   acetaminophen  (TYLENOL ) 500 MG tablet Take 500 mg by mouth 3 (three) times daily.   albuterol  (VENTOLIN  HFA) 108 (90 Base) MCG/ACT inhaler Inhale 2 puffs into the lungs every 6 (six) hours as needed for wheezing or shortness of breath.   cariprazine  (VRAYLAR ) 1.5 MG capsule Take 1 capsule (1.5 mg total) by mouth daily.   cetirizine  (ZYRTEC ) 10 MG tablet Take 1 tablet (10 mg total) by mouth daily.   diclofenac  (VOLTAREN ) 75 MG EC tablet Take 1 tablet (75 mg  total) by mouth 2 (two) times daily.   Dupilumab  (DUPIXENT ) 300 MG/2ML SOAJ Inject 300 mg into the skin every 14 (fourteen) days. Starting at day 15 for maintenance.   EPINEPHrine 0.3 mg/0.3 mL IJ SOAJ injection as directed Injection prn for systemic reactions for 30 days   fluticasone  (FLONASE) 50 MCG/ACT nasal spray Place 1 spray into both nostrils daily.   ibuprofen  (ADVIL ) 600 MG tablet Take 600 mg by mouth 3 (three) times daily.   ipratropium (ATROVENT ) 0.03 % nasal spray Place 2 sprays into both nostrils every 12 (twelve) hours for 3 days.   lisinopril  (ZESTRIL ) 5 MG tablet Take 1 tablet (5 mg total) by mouth daily.   lumateperone  tosylate (CAPLYTA ) 42 MG capsule Take 1 capsule (42 mg total) by mouth daily.   methocarbamol (ROBAXIN) 500 MG tablet Take 500 mg by mouth 4 (four) times daily.   Olopatadine HCl 0.2 % SOLN 1 drop into affected eye Ophthalmic Once a day or prn for 30 days   tacrolimus  (PROTOPIC ) 0.1 % ointment Apply topically 2 (two) times daily.   triamcinolone  cream (KENALOG ) 0.1 % Apply 1 Application topically 2 (two) times daily.   No facility-administered medications prior to visit.    Review of Systems  All other systems reviewed and are negative.  All negative Except see HPI       Objective    BP 133/87 (BP Location: Left Arm, Patient Position: Sitting)   Pulse 72   Resp 16   Ht 5' 3 (1.6 m)   Wt 243 lb 3.2 oz (110.3 kg)   SpO2 98%   BMI 43.08 kg/m     Physical Exam Vitals reviewed.  Constitutional:      General: She is not in acute distress.    Appearance: Normal appearance. She is well-developed. She is not diaphoretic.  HENT:     Head: Normocephalic and atraumatic.  Eyes:     General: No scleral icterus.    Conjunctiva/sclera: Conjunctivae normal.  Neck:     Thyroid : No thyromegaly.  Cardiovascular:     Rate and Rhythm: Normal rate and regular rhythm.     Pulses: Normal pulses.     Heart sounds: Normal heart sounds. No murmur  heard. Pulmonary:     Effort: Pulmonary effort is normal. No respiratory distress.     Breath sounds: Normal breath sounds. No wheezing, rhonchi or rales.  Musculoskeletal:     Cervical back: Neck supple.     Right lower leg: No edema.  Left lower leg: No edema.  Lymphadenopathy:     Cervical: No cervical adenopathy.  Skin:    General: Skin is warm and dry.     Findings: No rash.  Neurological:     Mental Status: She is alert and oriented to person, place, and time. Mental status is at baseline.  Psychiatric:        Mood and Affect: Mood normal.        Behavior: Behavior normal.      No results found for any visits on 10/26/23.      Assessment & Plan Carpal tunnel syndrome Chronic condition causing significant discomfort, managed with night bracing and alternating hot and cold baths. Discussed potential surgical intervention if symptoms worsen. - Advise use of night brace. - Recommend alternating hot and cold baths (2 minutes each, 10 repetitions). - Encourage wearing flexible support during work. - Discuss potential future surgical intervention if symptoms worsen.  Depression and anxiety Chronic Managed with therapy and medication. Recently started on Caplyta  42mg . Attends therapy and psychiatric appointments biweekly with psychiatrist Dorn Dales. Will follow-up  Hypertension Chronic Managed with lisinopril  5mg . Limited supply remaining. - Ensure lisinopril  prescription is refilled. Continue lifestyle modifications Will follow-up  nasal congestion/allergic rhinitis postnasal drainage and sore throat likely due to allergies/irritants. - Recommend warm salt water gargles for sore throat, otc pain management - Advise monitoring symptoms and report if she worsens.  Morbid Obesity Chronic Body mass index is 43.08 kg/m. Weight gain despite low food intake. Discussed meal timing and healthy snacking. - Advise on healthy diet with emphasis on breakfast and  lunch. - Recommend frequent healthy snacks. - Encourage 150 minutes of exercise per week. Will follow-up Consider a weight loss medication  Tobacco use Chronic Reduced smoking due to work restrictions. Discussed potential need for inhaler due to respiratory symptoms. Will reassess at the follow-up  Chronic bronchitis/chronic obstructive pulmonary disease Possible COPD indicated by lung sounds and smoking history. Discussed potential need for maintenance inhaler. - Prescribe maintenance inhaler if covered by insurance. - Instruct to check with pharmacy for coverage and alternatives if not covered.  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 Awaiting nerve block procedure. - Await scheduling of nerve block procedure. Will follow-up  Hypertriglyceridemia Chronic Continue lifestyle modifications Will follow-up  Encounter for screening involving social determinants of health (SDoH)  - AMB Referral VBCI Care Management  Immunization due  - Pneumococcal conjugate vaccine 20-valent  Carpal tunnel syndrome, bilateral  - Ambulatory referral to Orthopedics  Chronic bronchitis, unspecified chronic bronchitis type (HCC)  - fluticasone -salmeterol (WIXELA INHUB) 100-50 MCG/ACT AEPB; Inhale 1 puff into the lungs 2 (two) times daily.  Dispense: 60 each; Refill: 1   Orders Placed This Encounter  Procedures   Tdap vaccine greater than or equal to 7yo IM   Pneumococcal conjugate vaccine 20-valent   AMB Referral VBCI Care Management    Referral Priority:   Routine    Referral Type:   Consultation    Referral Reason:   Care Coordination    Number of Visits Requested:   1   Ambulatory referral to Orthopedics    Referral Priority:   Urgent    Referral Type:   Consultation    Number of Visits Requested:   1    Return in about 3 months (around 01/26/2024) for chronic disease f/u.   The patient was advised to call  back or seek an in-person evaluation if the symptoms worsen or if  the condition fails to improve as anticipated.  I discussed the assessment and treatment plan with the patient. The patient was provided an opportunity to ask questions and all were answered. The patient agreed with the plan and demonstrated an understanding of the instructions.  I, Gisell Buehrle, PA-C have reviewed all documentation for this visit. The documentation on 10/26/2023  for the exam, diagnosis, procedures, and orders are all accurate and complete.  Jolynn Spencer, [facility], MMS [facility] [phone] (phone) [phone] (fax)  [facility]

## 2023-10-28 ENCOUNTER — Telehealth: Payer: Self-pay

## 2023-10-28 NOTE — Progress Notes (Signed)
 Complex Care Management Note Care Guide Note  10/28/2023 Name: Kaylee Mcguire MRN: [id number] DOB: 1975-05-02   Complex Care Management Outreach Attempts: An unsuccessful telephone outreach was attempted today to offer the patient information about available complex care management services.  Follow Up Plan:  Additional outreach attempts will be made to offer the patient complex care management information and services.   Encounter Outcome:  No Answer  Leotis Rase [facility], [facility] Guide  Direct Dial: [phone]  Fax [phone]

## 2023-10-31 DIAGNOSIS — G5603 Carpal tunnel syndrome, bilateral upper limbs: Secondary | ICD-10-CM | POA: Diagnosis not present

## 2023-10-31 DIAGNOSIS — Z599 Problem related to housing and economic circumstances, unspecified: Secondary | ICD-10-CM | POA: Diagnosis not present

## 2023-10-31 NOTE — Progress Notes (Signed)
 Complex Care Management Note  Care Guide Note 10/31/2023 Name: Kaylee Mcguire MRN: [id number] DOB: [birth year]  Kaylee Mcguire is a 48 y.o. year old female who sees [place], Janna, PA-C for primary care. I reached out to Kaylee Mcguire by phone today to offer complex care management services.  Kaylee Mcguire was given information about Complex Care Management services today including:   The Complex Care Management services include support from the care team which includes your Nurse Care Manager, Clinical Social Worker, or Pharmacist.  The Complex Care Management team is here to help remove barriers to the health concerns and goals most important to you. Complex Care Management services are voluntary, and the patient may decline or stop services at any time by request to their care team member.   Complex Care Management Consent Status: Patient agreed to services and verbal consent obtained.   Follow up plan:  Telephone appointment with complex care management team member scheduled for:  11/02/23 @ 1 pm.   Encounter Outcome:  Patient Scheduled   Leotis Rase [facility] Guide  Direct Dial: [phone]  Fax [phone]

## 2023-11-02 ENCOUNTER — Other Ambulatory Visit: Payer: Self-pay

## 2023-11-02 ENCOUNTER — Ambulatory Visit: Admitting: Psychiatry

## 2023-11-02 NOTE — Patient Instructions (Signed)
 Visit Information  Kaylee Mcguire was given information about Medicaid Managed Care team care coordination services as a part of their [employer] Medicaid benefit.   If you would like to schedule transportation through your [facility] plan, please call the following number at least 2 days in advance of your appointment: [phone]  If you are experiencing a behavioral health crisis, call the [employer] [place]  Behavioral Health Crisis Line at [phone]). The line is available 24 hours a day, seven days a week.   Kaylee Mcguire - following are the goals we discussed in your visit today:    Goals Addressed             This Visit's Progress    VBCI RN Care Plan       Problems:  Chronic Disease Management support and education needs related to HTN  Goal: Over the next 90  days the Patient will attend all scheduled medical appointments: with providers   as evidenced by adherence to scheduled appointments         collaborate with the care management team towards completion of advanced directives after discussing with family as evidenced by advanced directive documents in medical record   demonstrate ongoing self health care management ability to monitor for exacerbations of hypertension  as evidenced by taking and recording daily blood pressure    take all medications exactly as prescribed and will call provider for medication related questions as evidenced by contacting provider with any mediation questions or concerns     verbalize understanding of plan for management of  hypertension as evidenced by verbalizing targeted blood pressure goal of <130/90   Interventions:   Hypertension Interventions: Last practice recorded BP readings:  BP Readings from Last 3 Encounters:  11/02/23 138/83  10/26/23 133/87  09/28/23 (!) 152/88   Most recent eGFR/CrCl:  Lab Results  Component Value Date   EGFR 71 07/20/2023    No components  found for: CRCL  Evaluation of current treatment plan related to hypertension self management and patient's adherence to plan as established by provider Provided education to patient re: stroke prevention, s/s of heart attack and stroke Reviewed medications with patient and discussed importance of compliance Counseled on the importance of exercise goals with target of 150 minutes per week Discussed plans with patient for ongoing care management follow up and provided patient with direct contact information for care management team Advised patient, providing education and rationale, to monitor blood pressure daily and record, calling PCP for findings outside established parameters Reviewed scheduled/upcoming provider appointments including:  Advised patient to discuss any medication concerns with provider Provided education on prescribed diet DASH  Discussed complications of poorly controlled blood pressure such as heart disease, stroke, circulatory complications, vision complications, kidney impairment, sexual dysfunction Screening for signs and symptoms of depression related to chronic disease state  Assessed social determinant of health barriers  Patient Self-Care Activities:  Attend all scheduled provider appointments Call pharmacy for medication refills 3-7 days in advance of running out of medications Call provider office for new concerns or questions  Notify RN Care Manager of TOC call rescheduling needs Take medications as prescribed   check blood pressure daily write blood pressure results in a log or diary take blood pressure log to all doctor appointments call doctor for signs and symptoms of high blood pressure keep all doctor appointments take medications for blood pressure exactly as prescribed begin an exercise program eat more whole grains, fruits and vegetables,  lean meats and healthy fats limit salt intake to <1500 mg/day  Plan:  Telephone follow up appointment with  care management team member scheduled for:  [id number] at 2:30 PM              Please see education materials related to managing Hypertension  provided by MyChart link.  Patient verbalizes understanding of instructions and care plan provided today and agrees to view in MyChart. Active MyChart status and patient understanding of how to access instructions and care plan via MyChart confirmed with patient.     Telephone follow up appointment with Managed Medicaid care management team member scheduled for: 11-23-2023 at 2:30 PM   Hendricks Her RN, BSN  [place] VBCI-Population Health RN Case Manager   Direct [phone]

## 2023-11-02 NOTE — Patient Outreach (Signed)
 Complex Care Management   Visit Note  11/02/2023  Name:  Kaylee Mcguire MRN: [id number] DOB: 1975-09-19  Situation: Referral received for Complex Care Management related to Hypertension  I obtained verbal consent from Patient.  Visit completed with patient  on the phone  Background:   Past Medical History:  Diagnosis Date   ADHD    ADHD (attention deficit hyperactivity disorder)    Allergy    Anxiety    Arthritis    Brain dysfunction    Carpal tunnel syndrome    Depression    Eczema    Eczema    IBS (irritable bowel syndrome)    Neuromuscular disorder (HCC)    PTSD (post-traumatic stress disorder)     Assessment: Patient Reported Symptoms:  Cognitive Cognitive Status: Difficulties with attention and concentration (pateint states ADHD sometimes makes this difficult) Cognitive/Intellectual Conditions Management [RPT]: None reported or documented in medical history or problem list   Health Maintenance Behaviors: Annual physical exam Healing Pattern: Unsure Health Facilitated by: Pain control, Rest  Neurological Neurological Review of Symptoms: Numbness (carpal tunnel) Neurological Management Strategies: Coping strategies, Medication therapy Neurological Self-Management Outcome: 4 (good)  HEENT HEENT Symptoms Reported: No symptoms reported HEENT Management Strategies: Coping strategies HEENT Self-Management Outcome: 4 (good)    Cardiovascular Cardiovascular Symptoms Reported: Palpitations (every once in a while Cardiologist aware) Does patient have uncontrolled Hypertension?: Yes Is patient checking Blood Pressure at home?: No Cardiovascular Management Strategies: Medication therapy, Coping strategies Weight: 241 lb (109.3 kg) (11-02-2023) Cardiovascular Self-Management Outcome: 4 (good)  Respiratory Respiratory Symptoms Reported: No symptoms reported Respiratory Management Strategies: Adequate rest, Routine screening Respiratory Self-Management Outcome: 4 (good)   Endocrine Endocrine Symptoms Reported: No symptoms reported Is patient diabetic?: No Endocrine Self-Management Outcome: 5 (very good)  Gastrointestinal Gastrointestinal Symptoms Reported: Other Other Gastrointestinal Symptoms: Patient states she has IBS but not officially diagnosed Gastrointestinal Management Strategies: Diet modification, Coping strategies Gastrointestinal Self-Management Outcome: 4 (good)    Genitourinary Genitourinary Symptoms Reported: No symptoms reported Genitourinary Management Strategies: Coping strategies Genitourinary Self-Management Outcome: 4 (good)  Integumentary Integumentary Symptoms Reported: No symptoms reported Skin Management Strategies: Routine screening Skin Self-Management Outcome: 4 (good)  Musculoskeletal Musculoskelatal Symptoms Reviewed: Muscle pain Musculoskeletal Management Strategies: Medication therapy, Coping strategies Musculoskeletal Self-Management Outcome: 4 (good) Falls in the past year?: Yes (fell at Grandsons skating party while on roller skates) Number of falls in past year: 1 or less Was there an injury with Fall?: Yes (Hit head but didnt seek medical attention) Fall Risk Category Calculator: 2 Patient Fall Risk Level: Moderate Fall Risk Patient at Risk for Falls Due to: History of fall(s) Fall risk Follow up: Falls evaluation completed, Education provided, Falls prevention discussed  Psychosocial Psychosocial Symptoms Reported: No symptoms reported Behavioral Management Strategies: Coping strategies, Adequate rest, Medication therapy Behavioral Health Self-Management Outcome: 4 (good) Major Change/Loss/Stressor/Fears (CP): Death of a loved one, Medical condition, self, Resources (Loss Father last year to suicide) Techniques to [employer] with Loss/Stress/Change: Medication, Counseling, Play (Grandson) Quality of Family Relationships: supportive, involved, helpful Do you feel physically threatened by others?: No      11/02/2023     1:54 PM  Depression screen PHQ 2/9  Decreased Interest 1  Down, Depressed, Hopeless 0  PHQ - 2 Score 1    Vitals:   11/02/23 1343  BP: 138/83    Medications Reviewed Today     Reviewed by Kay Hendricks MATSU, RN (Case Manager) on 11/02/23 at 1328  Med List Status: <None>   Medication Order  Taking? Sig Documenting Provider Last Dose Status Informant  acetaminophen  (TYLENOL ) 500 MG tablet [id number] Yes Take 500 mg by mouth 3 (three) times daily.  Patient taking differently: Take 1,000 mg by mouth 3 (three) times daily.   [provider]  Active   albuterol  (VENTOLIN  HFA) 108 (90 Base) MCG/ACT inhaler [id number] Yes Inhale 2 puffs into the lungs every 6 (six) hours as needed for wheezing or shortness of breath. Ostwalt, Janna, PA-C  Active   cariprazine  (VRAYLAR ) 1.5 MG capsule [id number]  Take 1 capsule (1.5 mg total) by mouth daily.  Patient not taking: Reported on 11/02/2023   Saucier, Dorn Ruth, NP  Consider Medication Status and Discontinue   cetirizine  (ZYRTEC ) 10 MG tablet [id number] Yes Take 1 tablet (10 mg total) by mouth daily. Ostwalt, Janna, PA-C  Active   diclofenac  (VOLTAREN ) 75 MG EC tablet [id number] Yes Take 1 tablet (75 mg total) by mouth 2 (two) times daily. Patel, Seema K, NP  Active   Dupilumab  (DUPIXENT ) 300 MG/2ML EMMANUEL [id number] Yes Inject 300 mg into the skin every 14 (fourteen) days. Starting at day 15 for maintenance. Claudene Lehmann, MD  Active   EPINEPHrine 0.3 mg/0.3 mL IJ SOAJ injection [id number] Yes as directed Injection prn for systemic reactions for 30 days [provider]  Active   fluticasone  (FLONASE) 50 MCG/ACT nasal spray [id number] Yes Place 1 spray into both nostrils daily. [provider]  Active   fluticasone -salmeterol (WIXELA INHUB) 100-50 MCG/ACT AEPB [id number] Yes Inhale 1 puff into the lungs 2 (two) times daily. Ostwalt, Janna, PA-C  Active   ibuprofen  (ADVIL ) 600 MG tablet [id number] Yes Take 600 mg by mouth 3 (three)  times daily. [provider]  Active   ipratropium (ATROVENT ) 0.03 % nasal spray [id number] Yes Place 2 sprays into both nostrils every 12 (twelve) hours for 3 days. Ostwalt, Janna, PA-C  Active   lisinopril  (ZESTRIL ) 5 MG tablet [id number]  Take 1 tablet (5 mg total) by mouth daily. Ostwalt, Janna, PA-C  Active   lumateperone  tosylate (CAPLYTA ) 42 MG capsule [id number] Yes Take 1 capsule (42 mg total) by mouth daily. Saucier, Dorn Ruth, NP  Active   methocarbamol (ROBAXIN) 500 MG tablet [id number]  Take 500 mg by mouth 4 (four) times daily.  Patient not taking: Reported on 11/02/2023   [provider]  Active   Olopatadine HCl 0.2 % SOLN [id number] Yes 1 drop into affected eye Ophthalmic Once a day or prn for 30 days [provider]  Active     Discontinued 01/02/20 1654   tacrolimus  (PROTOPIC ) 0.1 % ointment [id number] Yes Apply topically 2 (two) times daily. Claudene Lehmann, MD  Active     Discontinued 01/02/20 1654   triamcinolone  cream (KENALOG ) 0.1 % [id number] Yes Apply 1 Application topically 2 (two) times daily. Ostwalt, Janna, PA-C  Active             Recommendation:   PCP Follow-up appointment scheduled for 01-25-2024 at 1:20 PM  Continue Current Plan of Care  Follow Up Plan:   Telephone follow-up in 1 month  Hendricks Her RN, BSN  [place] [facility] VBCI-Population Health RN Case Manager   Direct [phone]

## 2023-11-07 ENCOUNTER — Telehealth: Payer: Self-pay | Admitting: *Deleted

## 2023-11-07 NOTE — Progress Notes (Signed)
 Complex Care Management Note Care Guide Note  11/07/2023 Name: Kaylee Mcguire MRN: [id number] DOB: 11/26/1975   Complex Care Management Outreach Attempts: An unsuccessful telephone outreach was attempted today to offer the patient information about available complex care management services.  Follow Up Plan:  Additional outreach attempts will be made to offer the patient complex care management information and services.   Encounter Outcome:  No Answer  [facility] Guide  Direct [phone] Fax:[phone] Website: [email or web address]

## 2023-11-08 ENCOUNTER — Ambulatory Visit (INDEPENDENT_AMBULATORY_CARE_PROVIDER_SITE_OTHER): Admitting: Psychiatry

## 2023-11-08 ENCOUNTER — Encounter: Payer: Self-pay | Admitting: Psychiatry

## 2023-11-08 ENCOUNTER — Telehealth: Payer: Self-pay | Admitting: *Deleted

## 2023-11-08 DIAGNOSIS — F314 Bipolar disorder, current episode depressed, severe, without psychotic features: Secondary | ICD-10-CM

## 2023-11-08 MED ORDER — LUMATEPERONE TOSYLATE 42 MG PO CAPS: 42.0000 mg | ORAL_CAPSULE | Freq: Every day | ORAL | 2 refills | Status: DC

## 2023-11-08 NOTE — Progress Notes (Signed)
 BH MD/PA/NP OP Progress Note  11/08/2023 1:04 PM Kaylee Mcguire  MRN:  [id number]  Chief Complaint: Routine Follow-up HPI: 48 year old female presenting ARPA for follow-up.  Patient reports that she is having to get carpal tunnel surgery soon due to bilateral complaint of wrist pain.  Patient reports that she is not ready for the surgery as this will put her out of work for a month.  Patient reports that she has not passed a probationary period of her work stating that she is really worried about having to lose time off as she is already behind on bills.  Patient reports that she is having to look for resources and is meeting with the social worker to help for resources as well.  Patient has been provided find help for financial resources to help with utilities with 5 to her friend vendors provided from find help.  Based on the patient's medications patient states she is doing good and stating that the Caplyta  is working well with minimal side effects.  Patient with no questions or concerns at this time.  Patient denies SI, HI, AVH.  Patient is in agreement with treatment plan.  Based on the patient's maintenance of medications patient is going to follow up in 3 months virtually. Visit Diagnosis:    ICD-10-CM   1. Bipolar disorder, current episode depressed, severe, without psychotic features (HCC)  F31.4 lumateperone  tosylate (CAPLYTA ) 42 MG capsule      Past Psychiatric History:  Previous Psych Hospitalizations: - Denies Outpatient treatment:  - Current PCP is Benedict Melody, PA-C Medications Current: - Caplyta  42mg  once daily Next Steps: - Evaluate with effectiveness of Vraylar  and monitor symptoms of possible hypomania Medication Trials: - Xanax, side effects and poor response - Abilify, side effects - 10/05/2023 Vraylar  1.5 mg once daily - 10/05/23 Vraylar  1.5 mg once daily, side effects of nausea Suicide & Violence: - Currently denies SI, HI, AVH Substance Use: -  Denies Psychotherapy: - [facility]  Legal:  - Denies  Past Medical History:  Past Medical History:  Diagnosis Date   ADHD    ADHD (attention deficit hyperactivity disorder)    Allergy    Anxiety    Arthritis    Brain dysfunction    Carpal tunnel syndrome    Depression    Eczema    Eczema    IBS (irritable bowel syndrome)    Neuromuscular disorder (HCC)    PTSD (post-traumatic stress disorder)    No past surgical history on file.  Family Psychiatric History: No additional  Family History:  Family History  Problem Relation Age of Onset   Aneurysm Mother    Diabetes Mother    Hypotension Mother    Allergies Mother    Food Allergy Mother    Cataracts Mother    COPD Mother    Heart disease Father    Diabetes Father    COPD Father    Hypertension Father    Diabetes Brother    Asthma Daughter    Kidney failure Daughter    Irritable bowel syndrome Daughter    Childhood respiratory disease Daughter    Aneurysm Maternal Grandmother    Heart failure Maternal Grandmother    Cancer Maternal Grandfather    Heart attack Paternal Grandmother    Bladder Cancer Neg Hx    Kidney cancer Neg Hx     Social History:  Social History   Socioeconomic History   Marital status: Significant Other    Spouse  name: Not on file   Number of children: 2   Years of education: Not on file   Highest education level: Some college, no degree  Occupational History   Not on file  Tobacco Use   Smoking status: Every Day    Current packs/day: 0.50    Types: Cigarettes   Smokeless tobacco: Never  Vaping Use   Vaping status: Never Used  Substance and Sexual Activity   Alcohol use: Not Currently   Drug use: No   Sexual activity: Yes    Birth control/protection: None  Other Topics Concern   Not on file  Social History Narrative   Not on file   Social Drivers of Health   Financial Resource Strain: High Risk (11/02/2023)   Overall Financial  Resource Strain (CARDIA)    Difficulty of Paying Living Expenses: Very hard  Food Insecurity: Food Insecurity Present (11/02/2023)   Hunger Vital Sign    Worried About Running Out of Food in the Last Year: Often true    Ran Out of Food in the Last Year: Often true  Transportation Needs: No Transportation Needs (11/02/2023)   PRAPARE - Administrator, Civil Service (Medical): No    Lack of Transportation (Non-Medical): No  Physical Activity: Insufficiently Active (04/07/2023)   Exercise Vital Sign    Days of Exercise per Week: 5 days    Minutes of Exercise per Session: 10 min  Stress: Stress Concern Present (04/07/2023)   [employer] of Occupational Health - Occupational Stress Questionnaire    Feeling of Stress : Rather much  Social Connections: Moderately Integrated (04/07/2023)   Social Connection and Isolation Panel    Frequency of Communication with Friends and Family: More than three times a week    Frequency of Social Gatherings with Friends and Family: More than three times a week    Attends Religious Services: More than 4 times per year    Active Member of [employer] or Organizations: No    Attends Banker Meetings: Not on file    Marital Status: Living with partner    Allergies:  Allergies  Allergen Reactions   Bee Venom Anaphylaxis    Carries Epi Pen    Gabapentin Hives   Codeine    Peanut-Containing Drug Products     Metabolic Disorder Labs: Lab Results  Component Value Date   HGBA1C 5.5 01/27/2023   No results found for: PROLACTIN Lab Results  Component Value Date   CHOL 156 07/20/2023   TRIG 97 07/20/2023   HDL 45 07/20/2023   CHOLHDL 3.5 07/20/2023   LDLCALC 93 07/20/2023   LDLCALC 70 01/27/2023   Lab Results  Component Value Date   TSH 1.490 01/27/2023   TSH 1.24 04/18/2013    Therapeutic Level Labs: No results found for: LITHIUM No results found for: VALPROATE No results found for: CBMZ  Current  Medications: Current Outpatient Medications  Medication Sig Dispense Refill   acetaminophen  (TYLENOL ) 500 MG tablet Take 500 mg by mouth 3 (three) times daily. (Patient taking differently: Take 1,000 mg by mouth 3 (three) times daily.)     albuterol  (VENTOLIN  HFA) 108 (90 Base) MCG/ACT inhaler Inhale 2 puffs into the lungs every 6 (six) hours as needed for wheezing or shortness of breath. 18 g 2   cariprazine  (VRAYLAR ) 1.5 MG capsule Take 1 capsule (1.5 mg total) by mouth daily.     cetirizine  (ZYRTEC ) 10 MG tablet Take 1 tablet (10 mg total) by mouth daily. 30  tablet 11   diclofenac  (VOLTAREN ) 75 MG EC tablet Take 1 tablet (75 mg total) by mouth 2 (two) times daily. 60 tablet 1   Dupilumab  (DUPIXENT ) 300 MG/2ML SOAJ Inject 300 mg into the skin every 14 (fourteen) days. Starting at day 15 for maintenance. 4 mL 5   EPINEPHrine 0.3 mg/0.3 mL IJ SOAJ injection as directed Injection prn for systemic reactions for 30 days     fluticasone  (FLONASE) 50 MCG/ACT nasal spray Place 1 spray into both nostrils daily.     fluticasone -salmeterol (WIXELA INHUB) 100-50 MCG/ACT AEPB Inhale 1 puff into the lungs 2 (two) times daily. 60 each 1   ibuprofen  (ADVIL ) 600 MG tablet Take 600 mg by mouth 3 (three) times daily.     ipratropium (ATROVENT ) 0.03 % nasal spray Place 2 sprays into both nostrils every 12 (twelve) hours for 3 days. 30 mL 0   lisinopril  (ZESTRIL ) 5 MG tablet Take 1 tablet (5 mg total) by mouth daily. 90 tablet 3   lumateperone  tosylate (CAPLYTA ) 42 MG capsule Take 1 capsule (42 mg total) by mouth daily. 30 capsule 0   methocarbamol (ROBAXIN) 500 MG tablet Take 500 mg by mouth 4 (four) times daily.     Olopatadine HCl 0.2 % SOLN 1 drop into affected eye Ophthalmic Once a day or prn for 30 days     tacrolimus  (PROTOPIC ) 0.1 % ointment Apply topically 2 (two) times daily. 100 g 0   triamcinolone  cream (KENALOG ) 0.1 % Apply 1 Application topically 2 (two) times daily. 463 g 0   No current  facility-administered medications for this visit.     Musculoskeletal: Strength & Muscle Tone: within normal limits Gait & Station: normal Patient leans: N/A  Psychiatric Specialty Exam: Review of Systems  Blood pressure 130/87, pulse 74, temperature 97.9 F (36.6 C), temperature source Oral, height 5' 3 (1.6 m), weight 241 lb (109.3 kg), SpO2 96%.Body mass index is 42.69 kg/m.  General Appearance: Well Groomed  Eye Contact:  Good  Speech:  Clear and Coherent  Volume:  Normal  Mood:  Anxious and Depressed  Affect:  Appropriate  Thought Process:  Coherent  Orientation:  Full (Time, Place, and Person)  Thought Content: WDL   Suicidal Thoughts:  No  Homicidal Thoughts:  No  Memory:  Immediate;   Good Recent;   Good Remote;   Good  Judgement:  Good  Insight:  Good  Psychomotor Activity:  Normal  Concentration:  Concentration: Good and Attention Span: Good  Recall:  Good  Fund of Knowledge: Good  Language: Good  Akathisia:  Yes  Handed:  Right  AIMS (if indicated):   Assets:  Desire for Improvement Financial Resources/Insurance Housing  ADL's:  Intact  Cognition: WNL  Sleep:  Good   Screenings: GAD-7    Flowsheet Row Office Visit from 10/26/2023 in [facility] Office Visit from 09/07/2023 in [facility] Office Visit from 08/31/2023 in [facility] Office Visit from 07/19/2023 in [facility] Office Visit from 04/07/2023 in [facility]  Total GAD-7 Score 7 14 13 7 12    PHQ2-9    Flowsheet Row Patient Outreach Telephone from 11/02/2023 in  [facility]ATION [facility] DEPARTMENT Office Visit from 10/26/2023 in [facility] Office Visit from 09/28/2023 in [facility] Visit from 09/07/2023 in [facility]  Office Visit from  08/31/2023 in [facility]  PHQ-2 Total Score 1 2 3 3 4   PHQ-9 Total Score -- 7 11 13 16      Assessment and Plan:  Assessment - Diagnosis: Bipolar disorder, current episode depressed, severe, without psychotic features (HCC) [F31.4]  2. Anxiety state [F41.1]  3. 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 - Psychotherapy: Referred to [facility] for trauma therapy.  - Education: Patient has been educated on how to contact this provider through my chart messaging or by calling the clinic.  Patient also educated on how to when to expect and reply and to give up to 24 hours for the provider to respond to messages sent to the clinic.  Patient has been educated on medications, usage, side effects and adverse reactions. - Follow-Up: Patient will follow up in 2 weeks in person - Referrals: Patient has been referred to [facility] for trauma therapy. - Safety Planning:  The patient has been educated, if they should have suicidal thoughts with or without a plan to call 911, or go to the closest emergency department.  Pt verbalized understanding.  Pt denies firearms within the home.  Pt also agrees to call the clinic should they have worsening symptoms.  Patient/Guardian was advised Release of Information must be obtained prior to any record release in order to collaborate their care with an outside provider. Patient/Guardian was advised if they have not already done so to contact the registration department to sign all necessary forms in order for us  to release information regarding their care.   Consent: Patient/Guardian gives verbal consent for treatment and assignment of benefits for services provided during this visit. Patient/Guardian expressed understanding and agreed to proceed.    Dorn Jama Der,  NP 11/08/2023, 1:04 PM

## 2023-11-08 NOTE — Progress Notes (Signed)
 Complex Care Management Note Care Guide Note  11/08/2023 Name: Kaylee Mcguire MRN: [id number] DOB: November 04, 1975   Complex Care Management Outreach Attempts: A second unsuccessful outreach was attempted today to offer the patient with information about available complex care management services.  Follow Up Plan:  Additional outreach attempts will be made to offer the patient complex care management information and services.   Encounter Outcome:  No Answer  [facility] HealthPopulation Health Care Guide  Direct Dial:[phone] Fax:[phone] Website: [email or web address]

## 2023-11-09 ENCOUNTER — Telehealth: Payer: Self-pay | Admitting: *Deleted

## 2023-11-09 NOTE — Progress Notes (Signed)
 Complex Care Management Note Care Guide Note  11/09/2023 Name: Kaylee Mcguire MRN: [id number] DOB: 10/01/1975   Complex Care Management Outreach Attempts: A third unsuccessful outreach was attempted today to offer the patient with information about available complex care management services.  Follow Up Plan: No Additional outreach attempts will be made to offer the patient complex care management information and services.   Encounter Outcome:  No Answer  [facility] HealthPopulation Health Care Guide  Direct [phone] [phone] Website: [email or web address]

## 2023-11-16 DIAGNOSIS — F319 Bipolar disorder, unspecified: Secondary | ICD-10-CM | POA: Diagnosis not present

## 2023-11-16 DIAGNOSIS — F4310 Post-traumatic stress disorder, unspecified: Secondary | ICD-10-CM | POA: Diagnosis not present

## 2023-11-23 ENCOUNTER — Other Ambulatory Visit: Payer: Self-pay

## 2023-11-23 ENCOUNTER — Ambulatory Visit: Admitting: Physician Assistant

## 2023-11-23 NOTE — Patient Instructions (Signed)
 Visit Information  [name] was given information about Medicaid Managed Care team care coordination services as a part of their [employer] Medicaid benefit.   If you would like to schedule transportation through your [employer] [facility] plan, please call the following number at least 2 days in advance of your appointment: [phone]  If you are experiencing a behavioral health crisis, call the [employer]   Behavioral Health Crisis Line at [phone]). The line is available 24 hours a day, seven days a week.   [name] - following are the goals we discussed in your visit today:    Goals Addressed             This Visit's Progress    VBCI RN Care Plan       Problems:  Chronic Disease Management support and education needs related to HTN  Goal: Over the next 90  days the Patient will attend all scheduled medical appointments: with providers   as evidenced by adherence to scheduled appointments         collaborate with the care management team towards completion of advanced directives after discussing with family as evidenced by advanced directive documents in medical record   demonstrate ongoing self health care management ability to monitor for exacerbations of hypertension  as evidenced by taking and recording daily blood pressure    take all medications exactly as prescribed and will call provider for medication related questions as evidenced by contacting provider with any mediation questions or concerns     verbalize understanding of plan for management of  hypertension as evidenced by verbalizing targeted blood pressure goal of <130/90   Interventions:   Hypertension Interventions: Last practice recorded BP readings:  BP Readings from Last 3 Encounters:  11/02/23 138/83  10/26/23 133/87  09/28/23 (!) 152/88   Most recent eGFR/CrCl:  Lab Results  Component Value Date   EGFR 71 07/20/2023    No components  found for: CRCL  Evaluation of current treatment plan related to hypertension self management and patient's adherence to plan as established by provider Provided education to patient re: stroke prevention, s/s of heart attack and stroke Reviewed medications with patient and discussed importance of compliance Counseled on the importance of exercise goals with target of 150 minutes per week Discussed plans with patient for ongoing care management follow up and provided patient with direct contact information for care management team Advised patient, providing education and rationale, to monitor blood pressure daily and record, calling PCP for findings outside established parameters Reviewed scheduled/upcoming provider appointments including:  Advised patient to discuss any medication concerns with provider Provided education on prescribed diet DASH  Discussed complications of poorly controlled blood pressure such as heart disease, stroke, circulatory complications, vision complications, kidney impairment, sexual dysfunction Screening for signs and symptoms of depression related to chronic disease state  Assessed social determinant of health barriers  Patient Self-Care Activities:  Attend all scheduled provider appointments Call pharmacy for medication refills 3-7 days in advance of running out of medications Call provider office for new concerns or questions  Notify RN Care Manager of TOC call rescheduling needs Take medications as prescribed   check blood pressure daily write blood pressure results in a log or diary take blood pressure log to all doctor appointments call doctor for signs and symptoms of high blood pressure keep all doctor appointments take medications for blood pressure exactly as prescribed begin an exercise program eat more whole grains, fruits and vegetables,  lean meats and healthy fats limit salt intake to <1500 mg/day  Plan:  Telephone follow up appointment with  care management team member scheduled for:  12-26-2023 at 3:00 PM              Please see education materials related to Hypertension management provided by MyChart link.  Patient verbalizes understanding of instructions and care plan provided today and agrees to view in MyChart. Active MyChart status and patient understanding of how to access instructions and care plan via MyChart confirmed with patient.     Telephone follow up appointment with Managed Medicaid care management team member scheduled for:12-26-2023 at 3:00 PM  Hendricks Her RN, BSN  [place] I VBCI-Population Health RN Case Manager   Direct [phone]   Following is a copy of your plan of care:  There are no care plans that you recently modified to display for this patient.

## 2023-11-27 NOTE — Progress Notes (Unsigned)
 Established patient visit  Patient: Kaylee Mcguire   DOB: 09-Jul-1975   48 y.o. Female  MRN: [id number] Visit Date: 11/28/2023  Today's healthcare provider: Jolynn Spencer, PA-C   No chief complaint on file.  Subjective       Discussed the use of AI scribe software for clinical note transcription with the patient, who gave verbal consent to proceed.  History of Present Illness        11/23/2023    3:03 PM 11/02/2023    1:54 PM 10/26/2023    3:39 PM  Depression screen PHQ 2/9  Decreased Interest 1 1 1   Down, Depressed, Hopeless 0 0 1  PHQ - 2 Score 1 1 2   Altered sleeping   1  Tired, decreased energy   1  Change in appetite   1  Feeling bad or failure about yourself    1  Trouble concentrating   1  Moving slowly or fidgety/restless   0  Suicidal thoughts   0  PHQ-9 Score   7  Difficult doing work/chores   Somewhat difficult      10/26/2023    3:39 PM 09/08/2023    3:45 PM 08/31/2023    2:12 PM 07/19/2023    3:21 PM  GAD 7 : Generalized Anxiety Score  Nervous, Anxious, on Edge 1  2 1   Control/stop worrying 1  1 1   Worry too much - different things 1  2 1   Trouble relaxing 1  2 1   Restless 1  2 1   Easily annoyed or irritable 1  2 1   Afraid - awful might happen 1  2 1   Total GAD 7 Score 7  13 7   Anxiety Difficulty Somewhat difficult  Somewhat difficult Somewhat difficult     Information is confidential and restricted. Go to Review Flowsheets to unlock data.    Medications: Outpatient Medications Prior to Visit  Medication Sig   acetaminophen  (TYLENOL ) 500 MG tablet Take 500 mg by mouth 3 (three) times daily. (Patient taking differently: Take 1,000 mg by mouth 3 (three) times daily.)   albuterol  (VENTOLIN  HFA) 108 (90 Base) MCG/ACT inhaler Inhale 2 puffs into the lungs every 6 (six) hours as needed for wheezing or shortness of breath.   cariprazine  (VRAYLAR ) 1.5 MG capsule Take 1 capsule (1.5 mg total) by mouth daily. (Patient not taking: Reported on 11/23/2023)    cetirizine  (ZYRTEC ) 10 MG tablet Take 1 tablet (10 mg total) by mouth daily.   diclofenac  (VOLTAREN ) 75 MG EC tablet Take 1 tablet (75 mg total) by mouth 2 (two) times daily.   Dupilumab  (DUPIXENT ) 300 MG/2ML SOAJ Inject 300 mg into the skin every 14 (fourteen) days. Starting at day 15 for maintenance.   EPINEPHrine 0.3 mg/0.3 mL IJ SOAJ injection as directed Injection prn for systemic reactions for 30 days   fluticasone  (FLONASE) 50 MCG/ACT nasal spray Place 1 spray into both nostrils daily.   fluticasone -salmeterol (WIXELA INHUB) 100-50 MCG/ACT AEPB Inhale 1 puff into the lungs 2 (two) times daily.   ibuprofen  (ADVIL ) 600 MG tablet Take 600 mg by mouth 3 (three) times daily.   ipratropium (ATROVENT ) 0.03 % nasal spray Place 2 sprays into both nostrils every 12 (twelve) hours for 3 days.   lisinopril  (ZESTRIL ) 5 MG tablet Take 1 tablet (5 mg total) by mouth daily.   lumateperone  tosylate (CAPLYTA ) 42 MG capsule Take 1 capsule (42 mg total) by mouth daily.   methocarbamol (ROBAXIN) 500 MG tablet Take 500 mg by mouth  4 (four) times daily. (Patient not taking: Reported on 11/23/2023)   Olopatadine HCl 0.2 % SOLN 1 drop into affected eye Ophthalmic Once a day or prn for 30 days   tacrolimus  (PROTOPIC ) 0.1 % ointment Apply topically 2 (two) times daily.   triamcinolone  cream (KENALOG ) 0.1 % Apply 1 Application topically 2 (two) times daily.   No facility-administered medications prior to visit.    Review of Systems  All other systems reviewed and are negative.  All negative Except see HPI   {Insert previous labs (optional):23779} {See past labs  Heme  Chem  Endocrine  Serology  Results Review (optional):1}   Objective    There were no vitals taken for this visit. {Insert last BP/Wt (optional):23777}{See vitals history (optional):1}   Physical Exam Vitals reviewed.  Constitutional:      General: She is not in acute distress.    Appearance: Normal appearance. She is well-developed.  She is not diaphoretic.  HENT:     Head: Normocephalic and atraumatic.  Eyes:     General: No scleral icterus.    Conjunctiva/sclera: Conjunctivae normal.  Neck:     Thyroid : No thyromegaly.  Cardiovascular:     Rate and Rhythm: Normal rate and regular rhythm.     Pulses: Normal pulses.     Heart sounds: Normal heart sounds. No murmur heard. Pulmonary:     Effort: Pulmonary effort is normal. No respiratory distress.     Breath sounds: Normal breath sounds. No wheezing, rhonchi or rales.  Musculoskeletal:     Cervical back: Neck supple.     Right lower leg: No edema.     Left lower leg: No edema.  Lymphadenopathy:     Cervical: No cervical adenopathy.  Skin:    General: Skin is warm and dry.     Findings: No rash.  Neurological:     Mental Status: She is alert and oriented to person, place, and time. Mental status is at baseline.  Psychiatric:        Mood and Affect: Mood normal.        Behavior: Behavior normal.      No results found for any visits on 11/28/23.      Assessment and Plan Assessment & Plan     No orders of the defined types were placed in this encounter.   No follow-ups on file.   The patient was advised to call back or seek an in-person evaluation if the symptoms worsen or if the condition fails to improve as anticipated.  I discussed the assessment and treatment plan with the patient. The patient was provided an opportunity to ask questions and all were answered. The patient agreed with the plan and demonstrated an understanding of the instructions.  I, Aldeen Riga, PA-C have reviewed all documentation for this visit. The documentation on 11/28/2023  for the exam, diagnosis, procedures, and orders are all accurate and complete.  Jolynn Spencer, [facility], MMS [facility] [phone] (phone) [phone] (fax)  [facility]

## 2023-11-28 ENCOUNTER — Ambulatory Visit: Admitting: Physician Assistant

## 2023-11-28 ENCOUNTER — Encounter: Payer: Self-pay | Admitting: Physician Assistant

## 2023-11-28 VITALS — BP 100/58 | HR 75 | Resp 16 | Wt 247.0 lb

## 2023-11-28 DIAGNOSIS — J42 Unspecified chronic bronchitis: Secondary | ICD-10-CM | POA: Diagnosis not present

## 2023-11-28 DIAGNOSIS — F32A Depression, unspecified: Secondary | ICD-10-CM | POA: Diagnosis not present

## 2023-11-28 DIAGNOSIS — E781 Pure hyperglyceridemia: Secondary | ICD-10-CM

## 2023-11-28 DIAGNOSIS — F419 Anxiety disorder, unspecified: Secondary | ICD-10-CM | POA: Diagnosis not present

## 2023-11-28 DIAGNOSIS — G894 Chronic pain syndrome: Secondary | ICD-10-CM | POA: Insufficient documentation

## 2023-11-28 DIAGNOSIS — Z720 Tobacco use: Secondary | ICD-10-CM | POA: Insufficient documentation

## 2023-11-28 DIAGNOSIS — I10 Essential (primary) hypertension: Secondary | ICD-10-CM | POA: Diagnosis not present

## 2023-11-28 DIAGNOSIS — G5603 Carpal tunnel syndrome, bilateral upper limbs: Secondary | ICD-10-CM | POA: Insufficient documentation

## 2023-11-29 ENCOUNTER — Ambulatory Visit: Payer: Self-pay | Admitting: Physician Assistant

## 2023-11-29 LAB — COMPREHENSIVE METABOLIC PANEL WITH GFR
ALT: 17 IU/L (ref 0–32)
AST: 14 IU/L (ref 0–40)
Albumin: 4.1 g/dL (ref 3.9–4.9)
Alkaline Phosphatase: 62 IU/L (ref 44–121)
BUN/Creatinine Ratio: 24 — ABNORMAL HIGH (ref 9–23)
BUN: 18 mg/dL (ref 6–24)
Bilirubin Total: <0.2 mg/dL (ref 0.0–1.2)
CO2: 19 mmol/L — ABNORMAL LOW (ref 20–29)
Calcium: 9.5 mg/dL (ref 8.7–10.2)
Chloride: 107 mmol/L — ABNORMAL HIGH (ref 96–106)
Creatinine, Ser: 0.74 mg/dL (ref 0.57–1.00)
Globulin, Total: 2.7 g/dL (ref 1.5–4.5)
Glucose: 82 mg/dL (ref 70–99)
Potassium: 4.6 mmol/L (ref 3.5–5.2)
Sodium: 141 mmol/L (ref 134–144)
Total Protein: 6.8 g/dL (ref 6.0–8.5)
eGFR: 100 mL/min/1.73 (ref >59)

## 2023-11-30 ENCOUNTER — Other Ambulatory Visit: Payer: Self-pay | Admitting: Physician Assistant

## 2023-12-02 DIAGNOSIS — J039 Acute tonsillitis, unspecified: Secondary | ICD-10-CM | POA: Diagnosis not present

## 2023-12-02 DIAGNOSIS — R070 Pain in throat: Secondary | ICD-10-CM | POA: Diagnosis not present

## 2023-12-02 DIAGNOSIS — Z20822 Contact with and (suspected) exposure to covid-19: Secondary | ICD-10-CM | POA: Diagnosis not present

## 2023-12-02 NOTE — Telephone Encounter (Signed)
 It was dispensed on 11/30/23 By a different provider. Please, continue a refill through him/her

## 2023-12-05 ENCOUNTER — Encounter: Payer: Self-pay | Admitting: Family Medicine

## 2023-12-05 ENCOUNTER — Ambulatory Visit (INDEPENDENT_AMBULATORY_CARE_PROVIDER_SITE_OTHER): Admitting: Family Medicine

## 2023-12-05 ENCOUNTER — Telehealth: Payer: Self-pay

## 2023-12-05 VITALS — BP 136/96 | HR 67 | Ht 63.0 in | Wt 244.3 lb

## 2023-12-05 DIAGNOSIS — J039 Acute tonsillitis, unspecified: Secondary | ICD-10-CM | POA: Diagnosis not present

## 2023-12-05 NOTE — Progress Notes (Signed)
      Acute visit   Patient: Kaylee Mcguire   DOB: 1975/06/02   48 y.o. Female  MRN: [id number] PCP: Ostwalt, Janna, PA-C   Chief Complaint  Patient presents with   Acute Visit    Patient reports she was seen at [facility] on Friday and was dx with tonsilitis and was given abx. Would like to see ENT but aware she is needing a referral. Symptoms slowly progressing reports she can talk a little better but still muffled. Tonsils swollen on right side   Subjective    Discussed the use of AI scribe software for clinical note transcription with the patient, who gave verbal consent to proceed.  History of Present Illness   Kaylee Mcguire is a 48 year old female who presents with persistent sore throat and enlarged tonsil after being diagnosed with tonsillitis.  She was diagnosed with tonsillitis at urgent care on Friday and started on amoxicillin-clavulanate 875 mg. After three days of treatment, she continues to experience a sore throat and her right tonsil remains enlarged. She is concerned about recurrent tonsillitis and the potential need for tonsillectomy. She is not allergic to any antibiotics except for sulfa, with an unknown specific reaction, and is aware of her allergies to codeine and other substances.       Review of Systems  Objective    BP (!) 136/96 (BP Location: Left Arm, Patient Position: Sitting, Cuff Size: Large)   Pulse 67   Ht 5' 3 (1.6 m)   Wt 244 lb 4.8 oz (110.8 kg)   SpO2 96%   BMI 43.28 kg/m   Physical Exam   Physical Exam   HEENT: Right tonsil enlarged compared to left, no plaques or white spots. NECK: No cervical lymphadenopathy. CHEST: Clear to auscultation bilaterally.        No results found for any visits on 12/05/23.  Assessment & Plan     Problem List Items Addressed This Visit   None Visit Diagnoses       Tonsillitis    -  Primary   Relevant Orders   Ambulatory referral to ENT           Acute right tonsillitis Acute right tonsillitis  with persistent sore throat and enlarged right tonsil, likely viral etiology due to absence of strep, flu, or COVID positive swab results and no plaques or white spots on tonsils. No swollen lymph nodes detected. - Continue Augmentin for one week for potential bacterial cause. - Use chloraseptic throat spray for symptomatic relief. - Take acetaminophen  and use cough drops to soothe throat. - Refer to [facility] for further evaluation if symptoms persist - pt requests referral as she is concerned about future episodes and need for tonsillectomy - discussed that this one episode does not indicate that she will have recurrence or the need for tonsillectomy. - Advise to report significant enlargement of tonsil.       No orders of the defined types were placed in this encounter.    Return if symptoms worsen or fail to improve.      Jon Eva, MD  [facility] [phone] (phone) [phone] (fax)  [facility]

## 2023-12-05 NOTE — Telephone Encounter (Signed)
 Patient requesting referral to ENT for chronic tonsillitis.   Please advise, thank you!

## 2023-12-05 NOTE — Telephone Encounter (Signed)
 Copied from CRM #[id number]. Topic: Referral - Request for Referral >> Dec 05, 2023  8:32 AM Laymon HERO wrote: Did the patient discuss referral with their provider in the last year? No (If No - schedule appointment) (If Yes - send message)  Appointment offered? No  Type of order/referral and detailed reason for visit: Tonsillitis   Preference of office, provider, location: [facility]  If referral order, have you been seen by this specialty before? Yes (If Yes, this issue or another issue? When? Where?  Can we respond through MyChart? Yes

## 2023-12-07 DIAGNOSIS — F319 Bipolar disorder, unspecified: Secondary | ICD-10-CM | POA: Diagnosis not present

## 2023-12-08 DIAGNOSIS — R490 Dysphonia: Secondary | ICD-10-CM | POA: Diagnosis not present

## 2023-12-21 DIAGNOSIS — F319 Bipolar disorder, unspecified: Secondary | ICD-10-CM | POA: Diagnosis not present

## 2023-12-26 ENCOUNTER — Other Ambulatory Visit: Payer: Self-pay

## 2023-12-26 NOTE — Patient Instructions (Signed)
 Visit Information  Ms. Heinecke was given information about Medicaid Managed Care team care coordination services as a part of their [employer] Medicaid benefit.   If you would like to schedule transportation through your [facility] plan, please call the following number at least 2 days in advance of your appointment: [phone]  If you are experiencing a behavioral health crisis, call the [employer] [facility] [place]  Behavioral Health Crisis Line at [phone]). The line is available 24 hours a day, seven days a week.   Please see education materials related to hypertension provided by MyChart link.  Patient verbalizes understanding of instructions and care plan provided today and agrees to view in MyChart. Active MyChart status and patient understanding of how to access instructions and care plan via MyChart confirmed with patient.      Social Worker will contact patient for initial intake assessment for SDOH needs .  Telephone follow up appointment with Managed Medicaid care management team member scheduled for:01-27-2024 2:00 PM   Hendricks Her RN, BSN  [place] VBCI-Population Health RN Case Manager   Direct [phone]   Following is a copy of your plan of care:  There are no care plans that you recently modified to display for this patient.

## 2023-12-28 DIAGNOSIS — R1310 Dysphagia, unspecified: Secondary | ICD-10-CM | POA: Diagnosis not present

## 2023-12-28 DIAGNOSIS — J381 Polyp of vocal cord and larynx: Secondary | ICD-10-CM | POA: Diagnosis not present

## 2023-12-28 DIAGNOSIS — F17200 Nicotine dependence, unspecified, uncomplicated: Secondary | ICD-10-CM | POA: Diagnosis not present

## 2023-12-29 ENCOUNTER — Other Ambulatory Visit: Payer: Self-pay | Admitting: Physician Assistant

## 2023-12-29 ENCOUNTER — Other Ambulatory Visit: Payer: Self-pay

## 2023-12-29 NOTE — Patient Outreach (Signed)
 Complex Care Management   Visit Note  12/29/2023  Name:  Kaylee Mcguire MRN: [id number] DOB: Apr 20, 1975  Situation: Referral received for Complex Care Management related to SDOH Barriers:  Housing permanent I obtained verbal consent from Patient.  Visit completed with Patient  on the phone  Background:   Past Medical History:  Diagnosis Date   ADHD    ADHD (attention deficit hyperactivity disorder)    Allergy    Anxiety    Arthritis    Brain dysfunction    Carpal tunnel syndrome    Depression    Eczema    Eczema    IBS (irritable bowel syndrome)    Neuromuscular disorder (HCC)    PTSD (post-traumatic stress disorder)     Assessment:  SW completed a telephone outreach with patient she states she has to be out of her home by Monday. She plans to go to a hotel until she is able to locate something else. SW and patient agreed for housing resources to be emailed to address on file.  SDOH Interventions    Flowsheet Row Patient Outreach Telephone from 12/29/2023 in [place] POPULATION HEALTH DEPARTMENT Patient Outreach Telephone from 12/26/2023 in [place] POPULATION HEALTH DEPARTMENT Patient Outreach Telephone from 11/23/2023 in [place] POPULATION HEALTH DEPARTMENT Patient Outreach Telephone from 11/02/2023 in [place] POPULATION HEALTH DEPARTMENT Office Visit from 10/26/2023 in [facility] Office Visit from 09/28/2023 in [place] [facility]  SDOH Interventions        Food Insecurity Interventions -- [employer] Referral, [facility] [employer] -- Social worker Referral, Engineer, maintenance -- --  Housing Interventions Community Resources Provided [employer] Referral [employer] Resource Referral [employer] Referral -- --  Transportation Interventions -- Intervention Not Indicated Intervention Not Indicated -- -- --  Utilities  Interventions -- Museum/gallery conservator Referral [employer] Resource Referral AMB Referral  [pt got a new job due to hands.] --  Depression Interventions/Treatment  -- -- -- -- -- Medication, Currently on Treatment  [PTSD anxiety]  Financial Strain Interventions -- -- -- [employer] Referral -- --  Health Literacy Interventions -- -- -- -- Intervention Not Indicated --      Recommendation:   No recommendations at this time  Follow Up Plan:   Telephone follow-up 01/09/24 at 1:30pm  Thersia Hoar, BSW, MHA [place]  [facility] Social Worker, Population Health [phone]

## 2024-01-04 ENCOUNTER — Other Ambulatory Visit: Payer: Self-pay | Admitting: Unknown Physician Specialty

## 2024-01-04 DIAGNOSIS — F4310 Post-traumatic stress disorder, unspecified: Secondary | ICD-10-CM | POA: Diagnosis not present

## 2024-01-04 DIAGNOSIS — F319 Bipolar disorder, unspecified: Secondary | ICD-10-CM | POA: Diagnosis not present

## 2024-01-04 DIAGNOSIS — R1310 Dysphagia, unspecified: Secondary | ICD-10-CM

## 2024-01-09 ENCOUNTER — Other Ambulatory Visit: Payer: Self-pay

## 2024-01-09 NOTE — Patient Outreach (Signed)
 Complex Care Management   Visit Note  01/09/2024  Name:  Kaylee Mcguire MRN: [id number] DOB: 1975/09/02  Situation: Referral received for Complex Care Management related to SDOH Barriers:  Financial Resource Strain I obtained verbal consent from Patient.  Visit completed with Patient  on the phone  Background:   Past Medical History:  Diagnosis Date   ADHD    ADHD (attention deficit hyperactivity disorder)    Allergy    Anxiety    Arthritis    Brain dysfunction    Carpal tunnel syndrome    Depression    Eczema    Eczema    IBS (irritable bowel syndrome)    Neuromuscular disorder (HCC)    PTSD (post-traumatic stress disorder)     Assessment: SW completed a telephone outreach with patient, she states she lost her job. She has applied for unemployment and is currently looking for employment. She was able to get all of her things out of her apartment and currently staying with her daughter. SW and patient agreed for employment resources to be emailed to address on file.  SDOH Interventions    Flowsheet Row Patient Outreach Telephone from 12/29/2023 in [place] POPULATION HEALTH DEPARTMENT Patient Outreach Telephone from 12/26/2023 in [place] POPULATION HEALTH DEPARTMENT Patient Outreach Telephone from 11/23/2023 in [place] POPULATION HEALTH DEPARTMENT Patient Outreach Telephone from 11/02/2023 in [place] POPULATION HEALTH DEPARTMENT Office Visit from 10/26/2023 in [facility] Office Visit from 09/28/2023 in [place] [facility]  SDOH Interventions        Food Insecurity Interventions -- [employer] Referral, [facility] [employer] -- Social worker Referral, Engineer, maintenance -- --  Housing Interventions Community Resources Provided [employer] Referral [employer] Resource Referral [employer] Referral -- --  Transportation  Interventions -- Intervention Not Indicated Intervention Not Indicated -- -- --  Utilities Interventions -- Museum/gallery conservator Referral [employer] Resource Referral AMB Referral  [pt got a new job due to hands.] --  Depression Interventions/Treatment  -- -- -- -- -- Medication, Currently on Treatment  [PTSD anxiety]  Financial Strain Interventions -- -- -- [employer] Referral -- --  Health Literacy Interventions -- -- -- -- Intervention Not Indicated --    Recommendation:   No recommendations at this time  Follow Up Plan:   Telephone follow-up 01/18/24 at 1130  Thersia Hoar, BSW, [facility] [place]  [facility] Social Worker, Population Health [phone]

## 2024-01-09 NOTE — Patient Instructions (Signed)
 Visit Information  Kaylee Mcguire was given information about Medicaid Managed Care team care coordination services as a part of their [employer] Medicaid benefit.   If you would like to schedule transportation through your [facility] plan, please call the following number at least 2 days in advance of your appointment: [phone]  If you are experiencing a behavioral health crisis, call the [employer] [facility] [place]  Behavioral Health Crisis Line at [phone]). The line is available 24 hours a day, seven days a week.     Social Worker will follow up in 7 days.   Thersia Hoar, [name], MHA [place]  [facility] Social Worker, Population Health [phone]   Following is a copy of your plan of care:  There are no care plans that you recently modified to display for this patient.

## 2024-01-11 ENCOUNTER — Ambulatory Visit
Admission: RE | Admit: 2024-01-11 | Discharge: 2024-01-11 | Disposition: A | Discharge status: Home / Self Care | Source: Ambulatory Visit | Attending: Unknown Physician Specialty | Admitting: Unknown Physician Specialty

## 2024-01-11 DIAGNOSIS — R1310 Dysphagia, unspecified: Secondary | ICD-10-CM | POA: Insufficient documentation

## 2024-01-11 DIAGNOSIS — F319 Bipolar disorder, unspecified: Secondary | ICD-10-CM | POA: Diagnosis not present

## 2024-01-11 DIAGNOSIS — F4310 Post-traumatic stress disorder, unspecified: Secondary | ICD-10-CM | POA: Diagnosis not present

## 2024-01-11 NOTE — Progress Notes (Addendum)
 Modified Barium Swallow Study  Patient Details  Name: Kaylee Mcguire MRN: [id number] Date of Birth: [birth year]  Today's Date: 01/11/2024  Modified Barium Swallow completed.  Full report located under Chart Review in the Imaging Section.  History of Present Illness Pt is a 48 yo female w/ chronic medical history including HTN, Obesity, IBS, chronic Pain w/ back discomfort and carpal tunnel syndrome, Tobacco use, bronchitis, and Anxiety disorder.  She shared Stressors (INCREASED) in her life including death of Parent last year and spouse/family issues ongoing.  She also endorsed s/s of REFLUX including GLOBUS, gag/cough, Regurgitation of food, and Belching. (pt has a sensitive gag reflex during swallowing of items of distaste per her report)      Pt has No h/o neurological disease/disorder; No dx'd pneumonia.  She recently had dx'd tonsilitis w/ tx per chart notes.  No recent weight loss reported.  She endorsed coughing easily and sometimes I throw it back up during meals but has not overtly changed her eating habits (regular diet at home).   Clinical Impression Patient appears to present w/ functional oropharyngeal phase swallowing w/ No laryngeal penetration nor aspiration noted during this study.  However, pt did demonstrate overt s/s of potential Esophageal phase Dysmotility c/b Mod Belching post trials/study. She endorsed a Globus sensation when I eat usually.  Suspect the episodes of Esophageal phase Dysmotility w/ Regurgitation (per pt report) and cough/gag could be related to pt's pt's overall discomfort when eating/drinking at meals.  ANY Esophageal phase deficits/dysmotility can impact the oropharyngeal phase of swallowing as well as desire for oral intake overall.   Oral phase was characterized by adequate lip closure, bolus preparation and containment, mastication of solid bolus, and anterior to posterior transit. Swallow initiation occured primarily at the level of the valleculae w/  inconsistent, trace spillage from the valleculae during larger bolus trials of thin liquids.  Pharyngeal phase was noted for adequate tongue base retraction, adequate hyolaryngeal excursion, and adequate pharyngeal constriction. Pharyngeal stripping wave is complete. Epiglottic inversion is complete during the swallow w/ No laryngeal penetration nor aspiration noted during the study. No pharyngeal residue post swallow noted; if any, it immediately cleared w/ her independent, dry swallow b/t trials.  Amplitude/duration of cricopharyngeus opening appeared [facility]. There was adequate/complete clearance through the upper cervical Esophagus viewable to the level of the shoulders. An Esophageal sweep was Not performed d/t pt's distaste of the barium trials and increased gagging w/ trials.    Videa viewed together afterwards, and pt was educated on the results of the study, including recommendation for GI f/u to further assess for any Esophageal phase Dysmotility and need for tx of the REFLUX s/s she c/o at home.  General aspiration precautions recommended.  Pt agreed. Factors that may increase risk of adverse event in presence of aspiration Noe & Lianne 2021): GI disease  Swallow Evaluation Recommendations Recommendations: PO diet PO Diet Recommendation: Regular; Thin liquids (Level 0) (cut foods small and moistened well; eat slowly) Liquid Administration via: Cup Medication Administration: Whole meds with liquid (or in a Puree if need) Supervision: Patient able to self-feed Swallowing strategies: Minimize environmental distractions; Slow rate; Small bites/sips; Follow solids with liquids Postural changes: Position pt fully upright for meals; Stay upright 30-60 min after meals; Out of bed for meals (general REFLUX precautions) Oral care recommendations: Oral care BID (2x/day); Pt independent with oral care Recommended consults: Consider GI consultation; Consider esophageal assessment (REFLUX s/s reported  by pt)        Comer  Mathew, MS, CCC-SLP Speech Language Pathologist Rehab Services; [facility] [place] [phone] (ascom) Iasiah Ozment 01/11/2024,5:50 PM

## 2024-01-11 NOTE — Progress Notes (Incomplete)
{  SLP ALL NOTES:[id number]} 

## 2024-01-12 ENCOUNTER — Telehealth: Payer: Self-pay

## 2024-01-12 DIAGNOSIS — K588 Other irritable bowel syndrome: Secondary | ICD-10-CM

## 2024-01-12 NOTE — Telephone Encounter (Signed)
 Copied from CRM #[id number]. Topic: Referral - Request for Referral >> Jan 11, 2024  1:38 PM Donna BRAVO wrote: Did the patient discuss referral with their provider in the last year? Yes (If No - schedule appointment) (If Yes - send message)  Appointment offered? No  Type of order/referral and detailed reason for visit: Referral for gastroenterology   Preference of office, provider, location: No preference  If referral order, have you been seen by this specialty before? No (If Yes, this issue or another issue? When? Where?  Can we respond through MyChart? Yes   ----------------------------------------------------------------------- From previous Reason for Contact - Referral Status: Reason for CRM:

## 2024-01-13 NOTE — Addendum Note (Signed)
 Addended by: WILFRED HARGIS RAMAN on: 01/13/2024 12:27 PM   Modules accepted: Orders

## 2024-01-18 ENCOUNTER — Other Ambulatory Visit: Payer: Self-pay

## 2024-01-18 NOTE — Patient Instructions (Signed)
 Visit Information  Kaylee Mcguire was given information about Medicaid Managed Care team care coordination services as a part of their [employer] Medicaid benefit.   If you would like to schedule transportation through your [facility] plan, please call the following number at least 2 days in advance of your appointment: [phone]  If you are experiencing a behavioral health crisis, call the [employer] [place]  Behavioral Health Crisis Line at [phone]). The line is available 24 hours a day, seven days a week.     The  Patient                                              has been provided with contact information for the Managed Medicaid care management team and has been advised to call with any health related questions or concerns.   Kaylee Mcguire, [name], MHA [place]  [facility] Institute Social Worker, Population Health [phone]   Following is a copy of your plan of care:  There are no care plans that you recently modified to display for this patient.

## 2024-01-18 NOTE — Patient Outreach (Signed)
 Social Drivers of Health  Community Resource and Care Coordination Visit Note   01/18/2024  Name: Kaylee Mcguire MRN: [id number] DOB:1975/05/24  Situation: Referral received for [facility] needs assessment and assistance related to Financial Strain . I obtained verbal consent from Patient.  Visit completed with Patient on the phone.   Background:      Assessment:   Goals Addressed             This Visit's Progress    BSW VBCI Social Work Care Plan       Problems:   Housing   CSW Clinical Goal(s):   Over the next 30 days the Patient will will follow up with housing resources as directed by Social Work.  Interventions:  SW emailed housing resources to email on file. SW will email resources for employment.  Patient Goals/Self-Care Activities:  Patient will use resources SW sent for housing assistance  Plan:   The patient has been provided with contact information for the care management team and has been advised to call with any health related questions or concerns.         Recommendation:   Patient will follow up with SW for any future needs  Follow Up Plan:   Patient has achieved all patient stated goals. [employer] will be closed. Patient has been provided contact information should new needs arise.   Thersia Hoar, [name], MHA [place]  [facility] Institute Social Worker, Population Health [phone]

## 2024-01-24 ENCOUNTER — Encounter: Payer: Self-pay | Admitting: Nurse Practitioner

## 2024-01-24 ENCOUNTER — Ambulatory Visit: Attending: Nurse Practitioner | Admitting: Nurse Practitioner

## 2024-01-24 VITALS — BP 132/95 | HR 74 | Temp 97.3°F | Ht 63.0 in | Wt 239.3 lb

## 2024-01-24 DIAGNOSIS — M5441 Lumbago with sciatica, right side: Secondary | ICD-10-CM | POA: Diagnosis not present

## 2024-01-24 DIAGNOSIS — M47816 Spondylosis without myelopathy or radiculopathy, lumbar region: Secondary | ICD-10-CM | POA: Insufficient documentation

## 2024-01-24 DIAGNOSIS — F418 Other specified anxiety disorders: Secondary | ICD-10-CM

## 2024-01-24 DIAGNOSIS — F32A Depression, unspecified: Secondary | ICD-10-CM | POA: Insufficient documentation

## 2024-01-24 DIAGNOSIS — F419 Anxiety disorder, unspecified: Secondary | ICD-10-CM | POA: Diagnosis not present

## 2024-01-24 DIAGNOSIS — G894 Chronic pain syndrome: Secondary | ICD-10-CM | POA: Diagnosis not present

## 2024-01-24 DIAGNOSIS — K588 Other irritable bowel syndrome: Secondary | ICD-10-CM | POA: Insufficient documentation

## 2024-01-24 DIAGNOSIS — Z6841 Body Mass Index (BMI) 40.0 and over, adult: Secondary | ICD-10-CM | POA: Diagnosis not present

## 2024-01-24 DIAGNOSIS — G8929 Other chronic pain: Secondary | ICD-10-CM | POA: Insufficient documentation

## 2024-01-24 MED ORDER — DICLOFENAC SODIUM 75 MG PO TBEC
75.0000 mg | DELAYED_RELEASE_TABLET | Freq: Two times a day (BID) | ORAL | 5 refills | Status: AC
Start: 2024-01-24 — End: ?

## 2024-01-24 NOTE — Progress Notes (Signed)
 PROVIDER NOTE: Interpretation of information contained herein should be left to medically-trained personnel. Specific patient instructions are provided elsewhere under Patient Instructions section of medical record. This document was created in part using AI and STT-dictation technology, any transcriptional errors that may result from this process are unintentional.  Patient: Kaylee Mcguire  Service: E/M   PCP: Mcguire, Janna, PA-C  DOB: 1975/09/21  DOS: 01/24/2024  Provider: Emmy MARLA Blanch, NP  MRN: [id number]  Delivery: Face-to-face  Specialty: Interventional Pain Management  Type: Established Patient  Setting: Ambulatory outpatient facility  Specialty designation: 09  Referring Prov.: Mcguire, Janna, PA-C  Location: Outpatient office facility       History of present illness (HPI) [name], a 48 y.o. year old female, is here today because of her Lumbar facet arthropathy [M47.816]. Kaylee Mcguire primary complain today is Back Pain (Lower back mainly on right side;)  Pertinent problems: Kaylee Mcguire Anxiety and depression; PTSD (post-traumatic stress disorder); Eczema; Chronic bilateral low back pain with right-sided sciatica; Other irritable bowel syndrome; Other specified attention deficit hyperactivity disorder (ADHD); Morbid obesity (HCC); Lower extremity edema; Smoking; Primary hypertension; Chronic pain syndrome, and Lumbar facet arthropathy on their problem list.   Pain Assessment: Severity of Chronic pain is reported as a 7 /10. Location: Back Right, Lower/radiates down to both hips. Onset: More than a month ago. Quality:  (excrutiating). Timing: Constant. Modifying factor(s): meds. Vitals:  height is 5' 3 (1.6 m) and weight is 239 lb 4.8 oz (108.5 kg). Her temperature is 97.3 F (36.3 C) (abnormal). Her blood pressure is 132/95 (abnormal) and her pulse is 74. Her oxygen saturation is 100%.  BMI: Estimated body mass index is 42.39 kg/m as calculated from the following:   Height as of  this encounter: 5' 3 (1.6 m).   Weight as of this encounter: 239 lb 4.8 oz (108.5 kg).  Last encounter: 09/28/2023. Last procedure: Visit date not found.  Reason for encounter: evaluation for possible interventional PM therapy/treatment and medication management.   Discussed the use of AI scribe software for clinical note transcription with the patient, who gave verbal consent to proceed.  History of Present Illness   Kaylee Mcguire is a 48 year old female with lumbar facet arthropathy who presents with excruciating back pain. She has been experiencing excruciating back pain, which worsened following a fall about a week and a half ago while taking her dog out. The pain has significantly impacted her daily activities, including getting out of bed and playing with her grandson. We discussed  Lumbar facet (medial branch block) block as an alternative interventional approach for better functional outcomes. The patient considers proceeding with the current plan.   Her current medication regimen includes diclofenac , which she has run out medication. She also takes ibuprofen  three times a day for carpal tunnel syndrome. No side effects from baclofen, which she finds effective in alleviating her pain and improving her functionality.  She had a carpal tunnel injection in July or August at [facility], at [facility]. She reports having had spine x-rays in the past.     Pharmacotherapy Assessment   Opioid Analgesic: none  MME/day: 00 mg/day  Monitoring: [place] PMP: PDMP reviewed during this encounter.       Pharmacotherapy: No side-effects or adverse reactions reported. Compliance: No problems identified. Effectiveness: Clinically acceptable.  Kaylee Nathanel PARAS, RN  01/24/2024 10:24 AM  Sign when Signing Visit Safety precautions to be maintained throughout the outpatient stay will include: orient  to surroundings, keep bed in low position, maintain call bell within reach at all times, provide  assistance with transfer out of bed and ambulation.     UDS:  No results found for: SUMMARY  No results found for: CBDTHCR No results found for: D8THCCBX No results found for: D9THCCBX  ROS  Constitutional: Denies any fever or chills Gastrointestinal: No reported hemesis, hematochezia, vomiting, or acute GI distress Musculoskeletal: Denies any acute onset joint swelling, redness, loss of ROM, or weakness Neurological: No reported episodes of acute onset apraxia, aphasia, dysarthria, agnosia, amnesia, paralysis, loss of coordination, or loss of consciousness  Medication Review  Dupilumab , EPINEPHrine, Olopatadine HCl, acetaminophen , albuterol , amoxicillin-clavulanate, cariprazine , cetirizine , diclofenac , fluticasone , fluticasone -salmeterol, ibuprofen , ipratropium, lisinopril , lumateperone  tosylate, meloxicam, methocarbamol, sertraline, tacrolimus , traZODone, and triamcinolone  cream  History Review  Allergy: Kaylee Mcguire is allergic to bee venom, gabapentin, codeine, peanut-containing drug products, and sulfa antibiotics. Drug: Kaylee Mcguire  reports no history of drug use. Alcohol:  reports that she does not currently use alcohol. Tobacco:  reports that she has been smoking cigarettes. She has never used smokeless tobacco. Social: Kaylee Mcguire  reports that she has been smoking cigarettes. She has never used smokeless tobacco. She reports that she does not currently use alcohol. She reports that she does not use drugs. Medical:  has a past medical history of ADHD, ADHD (attention deficit hyperactivity disorder), Allergy, Anxiety, Arthritis, Brain dysfunction, Carpal tunnel syndrome, Depression, Eczema, Eczema, IBS (irritable bowel syndrome), Neuromuscular disorder (HCC), and PTSD (post-traumatic stress disorder). Surgical: Kaylee Mcguire  has no past surgical history on file. Family: family history includes Allergies in her mother; Aneurysm in her maternal grandmother and mother; Asthma in her  daughter; COPD in her father and mother; Cancer in her maternal grandfather; Cataracts in her mother; Childhood respiratory disease in her daughter; Diabetes in her brother, father, and mother; Food Allergy in her mother; Heart attack in her paternal grandmother; Heart disease in her father; Heart failure in her maternal grandmother; Hypertension in her father; Hypotension in her mother; Irritable bowel syndrome in her daughter; Kidney failure in her daughter.  Laboratory Chemistry Profile   Renal Lab Results  Component Value Date   BUN 18 11/28/2023   CREATININE 0.74 11/28/2023   BCR 24 (H) 11/28/2023   GFRAA >60 04/13/2018   GFRNONAA >60 04/13/2018    Hepatic Lab Results  Component Value Date   AST 14 11/28/2023   ALT 17 11/28/2023   ALBUMIN 4.1 11/28/2023   ALKPHOS 62 11/28/2023   LIPASE 29 07/20/2016    Electrolytes Lab Results  Component Value Date   NA 141 11/28/2023   K 4.6 11/28/2023   CL 107 (H) 11/28/2023   CALCIUM 9.5 11/28/2023    Bone No results found for: VD25OH, VD125OH2TOT, CI6874NY7, CI7874NY7, 25OHVITD1, 25OHVITD2, 25OHVITD3, TESTOFREE, TESTOSTERONE  Inflammation (CRP: Acute Phase) (ESR: Chronic Phase) No results found for: CRP, ESRSEDRATE, LATICACIDVEN       Note: Above Lab results reviewed.  Recent Imaging Review   --Moderate to severe degenerative disc disease and facet arthropathy greatest at L1-L2 and L5-S1. Narrative  EXAM: XR LUMBAR SPINE 2 OR 3 VIEWS DATE: 12/14/2022 11:35 AM ACCESSION: [id number] UN DICTATED: 12/14/2022 11:55 AM INTERPRETATION LOCATION: [facility]  CLINICAL INDICATION: 48 years old Female with eval progression of lumbar spondylosis/concern for radiculopathy L5/S1  - M54.50 - Back pain, lumbosacral    COMPARISON: MRI lumbar spine 02/05/2020  TECHNIQUE: AP and lateral views of the lumbar spine.  FINDINGS: Mild to moderate lumbar levocurvature. Sacroiliac  joints are approximated with mild sclerotic  changes. No acute fracture. Vertebral heights are maintained. Intervertebral discs height loss with endplate sclerosis and osteophytosis greatest at L1-L2 and L5-S1. Minimal stepwise retrolisthesis from L1-L3. Multilevel facet arthropathy greatest in the lower lumbar spine. Soft tissues are unremarkable. Procedure Note  Andriette Nurse, MD - 12/14/2022 Formatting of this note might be different from the original. EXAM: XR LUMBAR SPINE 2 OR 3 VIEWS DATE: 12/14/2022 11:35 AM ACCESSION: [id number] UN DICTATED: 12/14/2022 11:55 AM INTERPRETATION LOCATION: [facility]  CLINICAL INDICATION: 48 years old Female with eval progression of lumbar spondylosis/concern for radiculopathy L5/S1  - M54.50 - Back pain, lumbosacral    COMPARISON: MRI lumbar spine 02/05/2020  TECHNIQUE: AP and lateral views of the lumbar spine.  FINDINGS: Mild to moderate lumbar levocurvature. Sacroiliac joints are approximated with mild sclerotic changes. No acute fracture. Vertebral heights are maintained. Intervertebral discs height loss with endplate sclerosis and osteophytosis greatest at L1-L2 and L5-S1. Minimal stepwise retrolisthesis from L1-L3. Multilevel facet arthropathy greatest in the lower lumbar spine. Soft tissues are unremarkable.  IMPRESSION: --Moderate to severe degenerative disc disease and facet arthropathy greatest at L1-L2 and L5-S1. Exam End: 12/14/22 11:35   Specimen Collected: 12/14/22 11:55 Last Resulted: 12/14/22 13:17  Received From: [facility]  Result Received: 06/20/23 15:25   Note: Reviewed        Physical Exam  Vitals: BP (!) 132/95   Pulse 74   Temp (!) 97.3 F (36.3 C)   Ht 5' 3 (1.6 m)   Wt 239 lb 4.8 oz (108.5 kg)   LMP 12/31/2023 (Exact Date)   SpO2 100%   BMI 42.39 kg/m  BMI: Estimated body mass index is 42.39 kg/m as calculated from the following:   Height as of this encounter: 5' 3 (1.6 m).   Weight as of this encounter: 239 lb 4.8 oz (108.5 kg). Ideal: Ideal body  weight: 52.4 kg (115 lb 8.3 oz) Adjusted ideal body weight: 74.9 kg (165 lb 0.5 oz) General appearance: Well nourished, well developed, and well hydrated. In no apparent acute distress Mental status: Alert, oriented x 3 (person, place, & time)       Respiratory: No evidence of acute respiratory distress Eyes: PERLA  Musculoskeletal: +LBP (facet loading)  Lumbar Exam  Skin & Axial Inspection: No masses, redness, or swelling Alignment: Symmetrical Functional ROM: Pain restricted ROM       Stability: No instability detected Muscle Tone/Strength: Functionally intact. No obvious neuro-muscular anomalies detected. Sensory (Neurological): Musculoskeletal pain pattern  Palpation: No palpable anomalies       Provocative Tests: Hyperextension/rotation test: (+) bilaterally for facet joint pain. Lumbar quadrant test (Kemp's test): (+) bilaterally for facet joint pain. Patrick's Maneuver: deferred today                   FABER* test: deferred today                   S-I anterior distraction/compression test: deferred today         S-I lateral compression test: deferred today         S-I Thigh-thrust test: deferred today         S-I Gaenslen's test: deferred today         *(Flexion, ABduction and External Rotation) Assessment   Diagnosis Status  1. Lumbar facet arthropathy   2. Chronic bilateral low back pain with right-sided sciatica   3. Chronic pain syndrome   4. Morbid obesity (HCC)  5. Other irritable bowel syndrome   6. Anxiety and depression    Persistent Persistent Controlled   Updated Problems: No problems updated.  Plan of Care  Problem-specific:  Assessment and Plan    Lumbar spondylosis with multilevel facet arthropathy and chronic pain syndrome Chronic lumbar pain due to multilevel facet arthropathy, primarily at L5-S1, with recent exacerbation following a fall. Her recent x-rays showed  - Moderate to severe degenerative disc disease and facet arthropathy greatest at  L1-L2 and L5-S1. - Schedule lumbar facet block with Dr. Marcelino  - Resubmit insurance approval for lumbar facet block   Chronic pain syndrome: Her current medication regimen includes diclofenac , which she has run out medication. She also takes ibuprofen  three times a day for carpal tunnel syndrome. No side effects from baclofen, which she finds effective in alleviating her pain and improving her functionality.  - Refilled diclofenac  prescription with refills.  Plan:  (ECT): (B) L-FCT (MBNB) Block # 1 (Sedation) with Dr. Marcelino    [name] has a current medication list which includes the following long-term medication(s): albuterol , cetirizine , fluticasone -salmeterol, ipratropium, lisinopril , fluticasone , [DISCONTINUED] sertraline, and [DISCONTINUED] trazodone.  Pharmacotherapy (Medications Ordered): Meds ordered this encounter  Medications   diclofenac  (VOLTAREN ) 75 MG EC tablet    Sig: Take 1 tablet (75 mg total) by mouth 2 (two) times daily.    Dispense:  60 tablet    Refill:  5   Orders:  Orders Placed This Encounter  Procedures   LUMBAR FACET(MEDIAL BRANCH NERVE BLOCK) MBNB    Diagnosis: Lumbar Facet Syndrome (M47.816); Lumbosacral Facet Syndrome (M47.817); Lumbar Facet Joint Pain (M54.59) Medical Necessity Statement: 1.Severe chronic axial low back pain causing functional impairment documented by ongoing pain scale assessments. 2.Pain present for longer than 3 months (Chronic) documented to have failed noninvasive conservative therapies. 3.Absence of untreated radiculopathy. 4.There is no radiological evidence of untreated fractures, tumor, infection, or deformity.  Physical Examination Findings: Positive Kemp Maneuver: (Y)  Positive Lumbar Hyperextension-Rotation provocative test: (Y)    Standing Status:   Future    Expected Date:   02/07/2024    Expiration Date:   01/23/2025    Scheduling Instructions:     Procedure: Lumbar facet Block     Type: Medial Branch  Block     Side: Bilateral     Purpose: Diagnostic/Therapeutic     Level(s): L4-5 and L5-S1 Facets (L4, L5, and S1 Medial Branch)     Sedation: With Sedation.     Timeframe: As soon as schedule allows.    Where will this procedure be performed?:   [facility]     Interventional Therapies  Risk Factors  Considerations  Medical Comorbidities:     Planned  Pending:      Under consideration:   Pending   Completed: (Analgesic benefit)1  None at this time   Therapeutic  Palliative (PRN) options:   None established   Completed by other providers:   None reported  1(Analgesic benefit): Expressed in percentage (%). (Local anesthetic[LA] +/- sedation  L.A.Local Anesthetic  Steroid benefit  Ongoing benefit)   Return in about 2 weeks (around 02/07/2024) for (ECT): (B) L-FCT (MBNB) Block # 1 (Sedation) with Dr. Marcelino .    Recent Visits No visits were found meeting these conditions. Showing recent visits within past 90 days and meeting all other requirements Today's Visits Date Type Provider Dept  01/24/24 Office Visit Taryll Reichenberger K, NP [facility]  Showing today's visits and meeting all  other requirements Future Appointments No visits were found meeting these conditions. Showing future appointments within next 90 days and meeting all other requirements  I discussed the assessment and treatment plan with the patient. The patient was provided an opportunity to ask questions and all were answered. The patient agreed with the plan and demonstrated an understanding of the instructions.  Patient advised to call back or seek an in-person evaluation if the symptoms or condition worsens. I personally spent a total of 30 minutes in the care of the patient today including preparing to see the patient, getting/reviewing separately obtained history, performing a medically appropriate exam/evaluation, counseling and educating, placing orders, referring and communicating  with other health care professionals, documenting clinical information in the EHR, independently interpreting results, communicating results, and coordinating care.  There are other unrelated non-urgent complaints, but due to the busy schedule and the amount of time I've already spent with her, time does not permit me to address these routine issues at today's visit. I've requested another appointment to review these additional issues.   Note by: Taytum Scheck K Wakeelah Solan, NP (TTS and AI technology used. I apologize for any typographical errors that were not detected and corrected.) Date: 01/24/2024; Time: 10:52 AM

## 2024-01-24 NOTE — Progress Notes (Signed)
 Safety precautions to be maintained throughout the outpatient stay will include: orient to surroundings, keep bed in low position, maintain call bell within reach at all times, provide assistance with transfer out of bed and ambulation.

## 2024-01-24 NOTE — Patient Instructions (Signed)
  ______________________________________________________________________    Procedure instructions  Stop blood-thinners  Do not eat or drink fluids (other than water) for 6 hours before your procedure  No water for 2 hours before your procedure  Take your blood pressure medicine with a sip of water  Arrive 30 minutes before your appointment  If sedation is planned, bring suitable driver. Kaylee Mcguire, [place], & public transportation are NOT APPROVED)  Carefully read the "Preparing for your procedure" detailed instructions  If you have questions call us  at [phone]  Procedure appointments are for procedures only.   NO medication refills or new problem evaluations will be done on procedure days.   Only the scheduled, pre-approved procedure and side will be done.   ______________________________________________________________________

## 2024-01-25 ENCOUNTER — Ambulatory Visit: Admitting: Physician Assistant

## 2024-01-25 DIAGNOSIS — F319 Bipolar disorder, unspecified: Secondary | ICD-10-CM | POA: Diagnosis not present

## 2024-01-25 DIAGNOSIS — F4310 Post-traumatic stress disorder, unspecified: Secondary | ICD-10-CM | POA: Diagnosis not present

## 2024-01-26 ENCOUNTER — Telehealth: Admitting: Psychiatry

## 2024-01-26 DIAGNOSIS — F411 Generalized anxiety disorder: Secondary | ICD-10-CM

## 2024-01-26 DIAGNOSIS — F314 Bipolar disorder, current episode depressed, severe, without psychotic features: Secondary | ICD-10-CM
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
## 2024-01-27 ENCOUNTER — Other Ambulatory Visit: Payer: Self-pay

## 2024-01-27 ENCOUNTER — Telehealth: Payer: Self-pay | Admitting: Psychiatry

## 2024-01-27 NOTE — Patient Instructions (Signed)
 Visit Information  Ms. Dicioccio was given information about Medicaid Managed Care team care coordination services as a part of their [employer] benefit.   If you would like to schedule transportation through your [employer] plan, please call the following number at least 2 days in advance of your appointment: [phone]  If you are experiencing a behavioral health crisis, call the [employer] [place]  Behavioral Health Crisis Line at [phone]). The line is available 24 hours a day, seven days a week.   Please see education materials related to Hypertension provided by MyChart link.  Patient verbalizes understanding of instructions and care plan provided today and agrees to view in MyChart. Active MyChart status and patient understanding of how to access instructions and care plan via MyChart confirmed with patient.     Telephone follow up appointment with Managed Medicaid care management team member scheduled for: 02-27-2024 at 1:00 PM   Hendricks Her RN, BSN  [place] [facility]BCI-Population Health RN Case Manager   Direct [phone]   Following is a copy of your plan of care: There are no care plans that you recently modified to display for this patient.

## 2024-01-27 NOTE — Telephone Encounter (Signed)
 ROI for letter to [facility] signed and scanned. Letter emailed to patient per requsted and scanned

## 2024-02-02 ENCOUNTER — Encounter: Payer: Self-pay | Admitting: Physician Assistant

## 2024-02-02 ENCOUNTER — Ambulatory Visit (INDEPENDENT_AMBULATORY_CARE_PROVIDER_SITE_OTHER): Admitting: Physician Assistant

## 2024-02-02 VITALS — BP 140/129 | HR 80 | Resp 16 | Ht 63.0 in | Wt 238.0 lb

## 2024-02-02 DIAGNOSIS — J069 Acute upper respiratory infection, unspecified: Secondary | ICD-10-CM

## 2024-02-02 MED ORDER — SINUS RINSE BOTTLE KIT NA PACK: 1.0000 | PACK | Freq: Every day | NASAL | Status: AC

## 2024-02-02 NOTE — Progress Notes (Signed)
 Established patient visit  Patient: Kaylee Mcguire   DOB: 06-07-1975   48 y.o. Female  MRN: [id number] Visit Date: 02/02/2024  Today's healthcare provider: Jolynn Spencer, PA-C   Chief Complaint  Patient presents with   sneezing    Ongoing 3 days covid negative test at home   Subjective      Discussed the use of AI scribe software for clinical note transcription with the patient, who gave verbal consent to proceed.  History of Present Illness KENNIE Mcguire is a 48 year old female who presents with nasal congestion and difficulty breathing.  She experiences significant nasal congestion and difficulty breathing, though she feels 'regulated right now'. She uses Flonase and antihistamines to manage her symptoms. No fever is present. She is under significant stress due to recent personal events, including her husband's stay in a rehabilitation facility. She is temporarily staying with her daughter and has applied for short-term disability following a back procedure.       01/27/2024    2:17 PM 12/26/2023    3:32 PM 11/23/2023    3:03 PM  Depression screen PHQ 2/9  Decreased Interest 0 0 1  Down, Depressed, Hopeless 1 1 0  PHQ - 2 Score 1 1 1       01/27/2024    2:18 PM 12/26/2023    3:33 PM 10/26/2023    3:39 PM 09/08/2023    3:45 PM  GAD 7 : Generalized Anxiety Score  Nervous, Anxious, on Edge 1 1 1    Control/stop worrying 1 3 1    Worry too much - different things 3 3 1    Trouble relaxing 1 1 1    Restless 1 1 1    Easily annoyed or irritable 1 2 1    Afraid - awful might happen 0 1 1   Total GAD 7 Score 8 12 7    Anxiety Difficulty Not difficult at all Not difficult at all Somewhat difficult      Information is confidential and restricted. Go to Review Flowsheets to unlock data.    Medications: Outpatient Medications Prior to Visit  Medication Sig   acetaminophen  (TYLENOL ) 500 MG tablet Take 500 mg by mouth 3 (three) times daily.   albuterol  (VENTOLIN  HFA) 108 (90 Base)  MCG/ACT inhaler Inhale 2 puffs into the lungs every 6 (six) hours as needed for wheezing or shortness of breath.   cetirizine  (ZYRTEC ) 10 MG tablet Take 1 tablet (10 mg total) by mouth daily.   diclofenac  (VOLTAREN ) 75 MG EC tablet Take 1 tablet (75 mg total) by mouth 2 (two) times daily.   Dupilumab  (DUPIXENT ) 300 MG/2ML SOAJ Inject 300 mg into the skin every 14 (fourteen) days. Starting at day 15 for maintenance.   EPINEPHrine 0.3 mg/0.3 mL IJ SOAJ injection as directed Injection prn for systemic reactions for 30 days   fluticasone -salmeterol (WIXELA INHUB) 100-50 MCG/ACT AEPB Inhale 1 puff into the lungs 2 (two) times daily. (Patient taking differently: Inhale 1 puff into the lungs as needed.)   ibuprofen  (ADVIL ) 600 MG tablet Take 600 mg by mouth 3 (three) times daily.   ipratropium (ATROVENT ) 0.03 % nasal spray Place 2 sprays into both nostrils every 12 (twelve) hours for 3 days.   lisinopril  (ZESTRIL ) 5 MG tablet Take 1 tablet (5 mg total) by mouth daily.   lumateperone  tosylate (CAPLYTA ) 42 MG capsule Take 1 capsule (42 mg total) by mouth daily.   tacrolimus  (PROTOPIC ) 0.1 % ointment Apply topically 2 (two) times daily. (Patient taking differently: 1  Application as needed.)   triamcinolone  cream (KENALOG ) 0.1 % Apply 1 Application topically 2 (two) times daily. (Patient taking differently: Apply 1 Application topically as needed.)   [DISCONTINUED] amoxicillin-clavulanate (AUGMENTIN) 875-125 MG tablet Take 1 tablet by mouth 2 (two) times daily. (Patient not taking: Reported on 01/27/2024)   [DISCONTINUED] cariprazine  (VRAYLAR ) 1.5 MG capsule Take 1 capsule (1.5 mg total) by mouth daily. (Patient not taking: Reported on 01/27/2024)   [DISCONTINUED] fluticasone  (FLONASE) 50 MCG/ACT nasal spray Place 1 spray into both nostrils daily. (Patient not taking: Reported on 01/27/2024)   [DISCONTINUED] meloxicam (MOBIC) 15 MG tablet Take 15 mg by mouth. (Patient not taking: Reported on 01/27/2024)    [DISCONTINUED] methocarbamol (ROBAXIN) 500 MG tablet Take 500 mg by mouth 4 (four) times daily. (Patient not taking: Reported on 01/27/2024)   [DISCONTINUED] Olopatadine HCl 0.2 % SOLN 1 drop into affected eye Ophthalmic Once a day or prn for 30 days (Patient not taking: Reported on 01/27/2024)   No facility-administered medications prior to visit.    Review of Systems All negative Except see HPI       Objective    BP (!) 140/129   Pulse 80   Resp 16   Ht 5' 3 (1.6 m)   Wt 238 lb (108 kg)   LMP 12/31/2023 (Exact Date)   SpO2 100%   BMI 42.16 kg/m     Physical Exam Vitals reviewed.  Constitutional:      General: She is not in acute distress.    Appearance: Normal appearance. She is well-developed and normal weight. She is not diaphoretic.  HENT:     Head: Normocephalic and atraumatic.     Right Ear: Ear canal and external ear normal.     Left Ear: Ear canal and external ear normal.     Nose: Congestion and rhinorrhea present.     Mouth/Throat:     Pharynx: Posterior oropharyngeal erythema present.     Comments: Postnasal drainage noted Eyes:     General: No scleral icterus.       Right eye: No discharge.        Left eye: No discharge.     Extraocular Movements: Extraocular movements intact.     Conjunctiva/sclera: Conjunctivae normal.     Pupils: Pupils are equal, round, and reactive to light.  Neck:     Thyroid : No thyromegaly.  Cardiovascular:     Rate and Rhythm: Normal rate and regular rhythm.     Pulses: Normal pulses.     Heart sounds: Normal heart sounds. No murmur heard. Pulmonary:     Effort: Pulmonary effort is normal. No respiratory distress.     Breath sounds: Normal breath sounds. No wheezing, rhonchi or rales.  Abdominal:     General: Abdomen is flat. Bowel sounds are normal.     Palpations: Abdomen is soft.  Musculoskeletal:     Cervical back: Neck supple.     Right lower leg: No edema.     Left lower leg: No edema.  Lymphadenopathy:      Cervical: No cervical adenopathy.  Skin:    General: Skin is warm and dry.     Findings: No rash.  Neurological:     Mental Status: She is alert and oriented to person, place, and time. Mental status is at baseline.  Psychiatric:        Mood and Affect: Mood normal.        Behavior: Behavior normal.      No results found  for any visits on 02/02/24.      Assessment & Plan Upper respiratory congestion Significant nasal congestion without fever. Symptoms managed with symptomatic treatment. - Continue Flonase nasal spray. - Continue antihistamines as needed. - Provided nasal rinse for symptomatic relief. Advised to drink plenty of water Advised otc pain medications for fever and pain control Will follow-up  Eye drop medication access issue Insurance no longer covers prescribed eye drops. Uncertainty about the prescribing provider. - Check with the prescribing provider regarding insurance coverage for eye drops. - Contact pharmacy to explore alternative options for eye drop coverage.    No orders of the defined types were placed in this encounter.   No follow-ups on file.   The patient was advised to call back or seek an in-person evaluation if the symptoms worsen or if the condition fails to improve as anticipated.  I discussed the assessment and treatment plan with the patient. The patient was provided an opportunity to ask questions and all were answered. The patient agreed with the plan and demonstrated an understanding of the instructions.  I, Kaylyn Garrow, PA-C have reviewed all documentation for this visit. The documentation on 02/02/2024  for the exam, diagnosis, procedures, and orders are all accurate and complete.  Jolynn Spencer, [facility], MMS [facility] [phone] (phone) [phone] (fax)  [facility]

## 2024-02-07 ENCOUNTER — Encounter: Payer: Self-pay | Admitting: Pain Medicine

## 2024-02-07 ENCOUNTER — Ambulatory Visit
Admission: RE | Admit: 2024-02-07 | Discharge: 2024-02-07 | Disposition: A | Discharge status: Home / Self Care | Source: Ambulatory Visit | Attending: Pain Medicine | Admitting: Pain Medicine

## 2024-02-07 ENCOUNTER — Ambulatory Visit (HOSPITAL_BASED_OUTPATIENT_CLINIC_OR_DEPARTMENT_OTHER): Admitting: Pain Medicine

## 2024-02-07 VITALS — BP 139/84 | HR 64 | Temp 97.0°F | Resp 16 | Ht 63.0 in | Wt 234.0 lb

## 2024-02-07 DIAGNOSIS — R052 Subacute cough: Secondary | ICD-10-CM | POA: Insufficient documentation

## 2024-02-07 DIAGNOSIS — M7138 Other bursal cyst, other site: Secondary | ICD-10-CM | POA: Insufficient documentation

## 2024-02-07 DIAGNOSIS — R937 Abnormal findings on diagnostic imaging of other parts of musculoskeletal system: Secondary | ICD-10-CM | POA: Diagnosis present

## 2024-02-07 DIAGNOSIS — M545 Low back pain, unspecified: Secondary | ICD-10-CM | POA: Insufficient documentation

## 2024-02-07 DIAGNOSIS — H1045 Other chronic allergic conjunctivitis: Secondary | ICD-10-CM | POA: Insufficient documentation

## 2024-02-07 DIAGNOSIS — J309 Allergic rhinitis, unspecified: Secondary | ICD-10-CM | POA: Insufficient documentation

## 2024-02-07 DIAGNOSIS — M5459 Other low back pain: Secondary | ICD-10-CM | POA: Insufficient documentation

## 2024-02-07 DIAGNOSIS — M47816 Spondylosis without myelopathy or radiculopathy, lumbar region: Secondary | ICD-10-CM | POA: Insufficient documentation

## 2024-02-07 DIAGNOSIS — L309 Dermatitis, unspecified: Secondary | ICD-10-CM | POA: Insufficient documentation

## 2024-02-07 DIAGNOSIS — G8929 Other chronic pain: Secondary | ICD-10-CM | POA: Insufficient documentation

## 2024-02-07 DIAGNOSIS — E66813 Obesity, class 3: Secondary | ICD-10-CM | POA: Insufficient documentation

## 2024-02-07 DIAGNOSIS — M79604 Pain in right leg: Secondary | ICD-10-CM | POA: Insufficient documentation

## 2024-02-07 DIAGNOSIS — Z91030 Bee allergy status: Secondary | ICD-10-CM | POA: Insufficient documentation

## 2024-02-07 DIAGNOSIS — T7819XA Other adverse food reactions, not elsewhere classified, initial encounter: Secondary | ICD-10-CM | POA: Insufficient documentation

## 2024-02-07 DIAGNOSIS — J3081 Allergic rhinitis due to animal (cat) (dog) hair and dander: Secondary | ICD-10-CM | POA: Insufficient documentation

## 2024-02-07 DIAGNOSIS — R21 Rash and other nonspecific skin eruption: Secondary | ICD-10-CM | POA: Insufficient documentation

## 2024-02-07 DIAGNOSIS — M533 Sacrococcygeal disorders, not elsewhere classified: Secondary | ICD-10-CM | POA: Insufficient documentation

## 2024-02-07 DIAGNOSIS — F17200 Nicotine dependence, unspecified, uncomplicated: Secondary | ICD-10-CM | POA: Insufficient documentation

## 2024-02-07 DIAGNOSIS — J301 Allergic rhinitis due to pollen: Secondary | ICD-10-CM | POA: Insufficient documentation

## 2024-02-07 MED ORDER — LIDOCAINE HCL 2 % IJ SOLN
INTRAMUSCULAR | Status: AC
  Filled 2024-02-07: qty 20

## 2024-02-07 MED ORDER — LIDOCAINE HCL 2 % IJ SOLN
20.0000 mL | Freq: Once | INTRAMUSCULAR | Status: AC
  Administered 2024-02-07: 400 mg

## 2024-02-07 MED ORDER — PENTAFLUOROPROP-TETRAFLUOROETH EX AERO
INHALATION_SPRAY | Freq: Once | CUTANEOUS | Status: AC
  Administered 2024-02-07: 30 via TOPICAL

## 2024-02-07 MED ORDER — TRIAMCINOLONE ACETONIDE 40 MG/ML IJ SUSP
INTRAMUSCULAR | Status: AC
  Filled 2024-02-07: qty 2

## 2024-02-07 MED ORDER — TRIAMCINOLONE ACETONIDE 40 MG/ML IJ SUSP
INTRAMUSCULAR | Status: AC
  Filled 2024-02-07: qty 1

## 2024-02-07 MED ORDER — TRIAMCINOLONE ACETONIDE 40 MG/ML IJ SUSP
80.0000 mg | Freq: Once | INTRAMUSCULAR | Status: AC
  Administered 2024-02-07: 80 mg

## 2024-02-07 MED ORDER — ROPIVACAINE HCL 2 MG/ML IJ SOLN
INTRAMUSCULAR | Status: AC
  Filled 2024-02-07: qty 20

## 2024-02-07 MED ORDER — ROPIVACAINE HCL 2 MG/ML IJ SOLN
18.0000 mL | Freq: Once | INTRAMUSCULAR | Status: AC
  Administered 2024-02-07: 18 mL via PERINEURAL

## 2024-02-07 MED ORDER — FENTANYL CITRATE (PF) 100 MCG/2ML IJ SOLN: 25.0000 ug | INTRAMUSCULAR | Status: DC | PRN

## 2024-02-07 MED ORDER — FENTANYL CITRATE (PF) 100 MCG/2ML IJ SOLN
INTRAMUSCULAR | Status: AC
  Filled 2024-02-07: qty 2

## 2024-02-07 MED ORDER — MIDAZOLAM HCL 5 MG/5ML IJ SOLN
INTRAMUSCULAR | Status: AC
Start: 2024-02-07 — End: 2024-02-07
  Filled 2024-02-07: qty 5

## 2024-02-07 MED ORDER — MIDAZOLAM HCL 5 MG/5ML IJ SOLN
0.5000 mg | Freq: Once | INTRAMUSCULAR | Status: AC
  Administered 2024-02-07: 3 mg via INTRAVENOUS

## 2024-02-07 NOTE — Progress Notes (Signed)
 Safety precautions to be maintained throughout the outpatient stay will include: orient to surroundings, keep bed in low position, maintain call bell within reach at all times, provide assistance with transfer out of bed and ambulation.

## 2024-02-07 NOTE — Progress Notes (Signed)
 PROVIDER NOTE: Interpretation of information contained herein should be left to medically-trained personnel. Specific patient instructions are provided elsewhere under Patient Instructions section of medical record. This document was created in part using STT-dictation technology, any transcriptional errors that may result from this process are unintentional.  Patient: Kaylee Mcguire Type: Established DOB: [birth year] MRN: [id number] PCP: Ostwalt, Janna, PA-C  Service: Procedure DOS: 02/07/2024 Setting: Ambulatory Location: Ambulatory outpatient facility Delivery: Face-to-face Provider: Eric DELENA Como, MD Specialty: Interventional Pain Management Specialty designation: 09 Location: Outpatient facility Ref. Prov.: Ostwalt, Janna, PA-C       Interventional Therapy   Type: Lumbar Facet, Medial Branch Block(s) (w/ fluoroscopic mapping) #1  Laterality: Bilateral  Level: L2, L3, L4, L5, and S1 Medial Branch/Dorsal Rami Level(s). Injecting these levels blocks the L3-4, L4-5, and L5-S1 lumbar facet joints. Imaging: Fluoroscopic guidance Spinal ([id number]) Anesthesia: Local anesthesia (1-2% Lidocaine ) Anxiolysis: IV Versed  3.0 mg            Sedation: Minimal Sedation None required. No Fentanyl  administered.         DOS: 02/07/2024 Performed by: Eric DELENA Como, MD  Primary Purpose: Diagnostic/Therapeutic Indications: Low back pain severe enough to impact quality of life or function. 1. Chronic low back pain (Bilateral) w/o sciatica   2. Lumbar facet joint pain   3. Lumbar facet joint synovial cyst   4. Lumbar facet joint syndrome   5. Lumbar facet arthropathy (Multilevel) (Bilateral)   6. Spondylosis without myelopathy or radiculopathy, lumbar region   7. Low back pain of over 3 months duration   8. Low back pain radiating to right leg   9. Multifactorial low back pain   10. Abnormal MRI, lumbar spine (02/05/2020) ([facility])    NAS-11 Pain score:   Pre-procedure: 10-Worst pain  ever/10   Post-procedure: 0-No pain/10     Position / Prep / Materials:  Position: Prone  Prep solution: ChloraPrep (2% chlorhexidine gluconate and 70% isopropyl alcohol) Area Prepped: Posterolateral Lumbosacral Spine (Wide prep: From the lower border of the scapula down to the end of the tailbone and from flank to flank.)  Materials:  Tray: Block Needle(s):  Type: Spinal  Gauge (G): 22  Length: 5-in Qty: 4     H&P (Pre-op Assessment):  Kaylee Mcguire is a 48 y.o. (year old), female patient, seen today for interventional treatment. She  has no past surgical history on file. Kaylee Mcguire has a current medication list which includes the following prescription(s): acetaminophen , albuterol , cetirizine , diclofenac , dupixent , epinephrine, fluticasone -salmeterol, sinus rinse bottle kit, ibuprofen , ipratropium, lisinopril , lumateperone  tosylate, tacrolimus , triamcinolone  cream, [DISCONTINUED] sertraline, and [DISCONTINUED] trazodone, and the following Facility-Administered Medications: fentanyl . Her primarily concern today is the Back Pain  Initial Vital Signs:  Pulse/HCG Rate: 64ECG Heart Rate: 69 Temp: (!) 97 F (36.1 C) Resp: 18 BP: (!) 144/75 SpO2: 100 %  BMI: Estimated body mass index is 41.45 kg/m as calculated from the following:   Height as of this encounter: 5' 3 (1.6 m).   Weight as of this encounter: 234 lb (106.1 kg).  Risk Assessment: Allergies: Reviewed. She is allergic to bee venom, gabapentin, codeine, peanut-containing drug products, and sulfa antibiotics.  Allergy Precautions: None required Coagulopathies: Reviewed. None identified.  Blood-thinner therapy: None at this time Active Infection(s): Reviewed. None identified. Kaylee Mcguire is afebrile  Site Confirmation: Kaylee Mcguire was asked to confirm the procedure and laterality before marking the site Procedure checklist: Completed Consent: Before the procedure and under the influence of no sedative(s), amnesic(s), or  anxiolytics,  the patient was informed of the treatment options, risks and possible complications. To fulfill our ethical and legal obligations, as recommended by the American Medical Association's Code of Ethics, I have informed the patient of my clinical impression; the nature and purpose of the treatment or procedure; the risks, benefits, and possible complications of the intervention; the alternatives, including doing nothing; the risk(s) and benefit(s) of the alternative treatment(s) or procedure(s); and the risk(s) and benefit(s) of doing nothing. The patient was provided information about the general risks and possible complications associated with the procedure. These may include, but are not limited to: failure to achieve desired goals, infection, bleeding, organ or nerve damage, allergic reactions, paralysis, and death. In addition, the patient was informed of those risks and complications associated to Spine-related procedures, such as failure to decrease pain; infection (i.e.: Meningitis, epidural or intraspinal abscess); bleeding (i.e.: epidural hematoma, subarachnoid hemorrhage, or any other type of intraspinal or peri-dural bleeding); organ or nerve damage (i.e.: Any type of peripheral nerve, nerve root, or spinal cord injury) with subsequent damage to sensory, motor, and/or autonomic systems, resulting in permanent pain, numbness, and/or weakness of one or several areas of the body; allergic reactions; (i.e.: anaphylactic reaction); and/or death. Furthermore, the patient was informed of those risks and complications associated with the medications. These include, but are not limited to: allergic reactions (i.e.: anaphylactic or anaphylactoid reaction(s)); adrenal axis suppression; blood sugar elevation that in diabetics may result in ketoacidosis or comma; water retention that in patients with history of congestive heart failure may result in shortness of breath, pulmonary edema, and decompensation  with resultant heart failure; weight gain; swelling or edema; medication-induced neural toxicity; particulate matter embolism and blood vessel occlusion with resultant organ, and/or nervous system infarction; and/or aseptic necrosis of one or more joints. Finally, the patient was informed that Medicine is not an exact science; therefore, there is also the possibility of unforeseen or unpredictable risks and/or possible complications that may result in a catastrophic outcome. The patient indicated having understood very clearly. We have given the patient no guarantees and we have made no promises. Enough time was given to the patient to ask questions, all of which were answered to the patient's satisfaction. Ms. Sesay has indicated that she wanted to continue with the procedure. Attestation: I, the ordering provider, attest that I have discussed with the patient the benefits, risks, side-effects, alternatives, likelihood of achieving goals, and potential problems during recovery for the procedure that I have provided informed consent. Date  Time: 02/07/2024 10:03 AM  Pre-Procedure Preparation:  Monitoring: As per clinic protocol. Respiration, ETCO2, SpO2, BP, heart rate and rhythm monitor placed and checked for adequate function Safety Precautions: Patient was assessed for positional comfort and pressure points before starting the procedure. Time-out: I initiated and conducted the Time-out before starting the procedure, as per protocol. The patient was asked to participate by confirming the accuracy of the Time Out information. Verification of the correct person, site, and procedure were performed and confirmed by me, the nursing staff, and the patient. Time-out conducted as per Joint Commission's Universal Protocol (UP.01.01.01). Time: 1133 Start Time: 1133 hrs.  Description of Procedure:          Laterality: (see above) Targeted Levels: (see above)  Safety Precautions: Aspiration looking for  blood return was conducted prior to all injections. At no point did we inject any substances, as a needle was being advanced. Before injecting, the patient was told to immediately notify me if she was experiencing  any new onset of ringing in the ears, or metallic taste in the mouth. No attempts were made at seeking any paresthesias. Safe injection practices and needle disposal techniques used. Medications properly checked for expiration dates. SDV (single dose vial) medications used. After the completion of the procedure, all disposable equipment used was discarded in the proper designated medical waste containers. Local Anesthesia: Protocol guidelines were followed. The patient was positioned over the fluoroscopy table. The area was prepped in the usual manner. The time-out was completed. The target area was identified using fluoroscopy. A 12-in long, straight, sterile hemostat was used with fluoroscopic guidance to locate the targets for each level blocked. Once located, the skin was marked with an approved surgical skin marker. Once all sites were marked, the skin (epidermis, dermis, and hypodermis), as well as deeper tissues (fat, connective tissue and muscle) were infiltrated with a small amount of a short-acting local anesthetic, loaded on a 10cc syringe with a 25G, 1.5-in  Needle. An appropriate amount of time was allowed for local anesthetics to take effect before proceeding to the next step. Local Anesthetic: Lidocaine  2.0% The unused portion of the local anesthetic was discarded in the proper designated containers. Technical description of process:  Medial Branch  Dorsal Rami Nerve Block (MBB):  Neuroanatomy note: Each lumbar facet joint receives dual innervation from medial branches arising from the posterior primary rami at the same level and one level above. The target for each lumbar medial branch is the junction of the ipsilateral superior articular and transverse process of the lower  vertebral body. (i.e.: The L4-L5 facet joint is innervated by the L4 medial branch [located at L5] and the L3 medial branch [located at L4]. Blocking the L4 Medial Branch is therefore achieved by injecting at the junction of the ipsilateral superior articular and transverse process of the lower vertebral body [L5].).  Exception: The exception to the above rule is the L5-S1 facet joint which has triple innervation requiring the L4 medial branch, as well as the L5 and the S1 Dorsal Rami(s) to be blocked to fully denervate the joint.  Under fluoroscopic guidance, a needle was inserted until contact was made with os over the target area. After negative aspiration, 0.5 mL of the nerve block solution was injected without difficulty or complication. Paresthesia were avoided during injection. The needle(s) were removed intact and without complication.  Once the entire procedure was completed, the treated area was cleaned, making sure to leave some of the prepping solution back to take advantage of its long term bactericidal properties.         Illustration of the posterior view of the lumbar spine and the posterior neural structures. Laminae of L2 through S1 are labeled. DPRL5, dorsal primary ramus of L5; DPRS1, dorsal primary ramus of S1; DPR3, dorsal primary ramus of L3; FJ, facet (zygapophyseal) joint L3-L4; I, inferior articular process of L4; LB1, lateral branch of dorsal primary ramus of L1; IAB, inferior articular branches from L3 medial branch (supplies L4-L5 facet joint); IBP, intermediate branch plexus; MB3, medial branch of dorsal primary ramus of L3; NR3, third lumbar nerve root; S, superior articular process of L5; SAB, superior articular branches from L4 (supplies L4-5 facet joint also); TP3, transverse process of L3.   Facet Joint Innervation (* possible contribution)  L1-2 T12, L1 (L2*)  Medial Branch  L2-3 L1, L2 (L3*)                     L3-4 L2, L3 (  L4*)                     L4-5 L3,  L4 (L5*)                     L5-S1 L4, L5, S1                        Vitals:   02/07/24 1140 02/07/24 1145 02/07/24 1155 02/07/24 1205  BP: (!) 140/95 (!) 144/95 135/83 139/84  Pulse:      Resp: 20 19 18 16   Temp:      SpO2: 100% 99% 99% 99%  Weight:      Height:         End Time: 1144 hrs.  Imaging Guidance (Spinal):         Type of Imaging Technique: Fluoroscopy Guidance (Spinal) Indication(s): Fluoroscopy guidance for needle placement to enhance accuracy in procedures requiring precise needle localization for targeted delivery of medication in or near specific anatomical locations not easily accessible without such real-time imaging assistance. Exposure Time: Please see nurses notes. Contrast: None used. Fluoroscopic Guidance: I was personally present during the use of fluoroscopy. Tunnel Vision Technique used to obtain the best possible view of the target area. Parallax error corrected before commencing the procedure. Direction-depth-direction technique used to introduce the needle under continuous pulsed fluoroscopy. Once target was reached, antero-posterior, oblique, and lateral fluoroscopic projection used confirm needle placement in all planes. Images permanently stored in EMR. Interpretation: No contrast injected. I personally interpreted the imaging intraoperatively. Adequate needle placement confirmed in multiple planes. Permanent images saved into the patient's record.  Post-operative Assessment:  Post-procedure Vital Signs:  Pulse/HCG Rate: 6472 Temp: (!) 97 F (36.1 C) Resp: 16 BP: 139/84 SpO2: 99 %  EBL: None  Complications: No immediate post-treatment complications observed by team, or reported by patient.  Note: The patient tolerated the entire procedure well. A repeat set of vitals were taken after the procedure and the patient was kept under observation following institutional policy, for this type of procedure. Post-procedural neurological assessment  was performed, showing return to baseline, prior to discharge. The patient was provided with post-procedure discharge instructions, including a section on how to identify potential problems. Should any problems arise concerning this procedure, the patient was given instructions to immediately contact us , at any time, without hesitation. In any case, we plan to contact the patient by telephone for a follow-up status report regarding this interventional procedure.  Comments:  No additional relevant information.  Plan of Care (POC)  Orders:  Orders Placed This Encounter  Procedures   LUMBAR FACET(MEDIAL BRANCH NERVE BLOCK) MBNB    Scheduling Instructions:     Procedure: Lumbar facet block (AKA.: Lumbosacral medial branch nerve block)     Side: Bilateral     Level: L3-4, L4-5, L5-S1, and TBD Facets (L2, L3, L4, L5, S1, and TBD Medial Branch Nerves)     Sedation: Patient's choice.     Date: 02/07/2024    Where will this procedure be performed?:   [facility]   DG [facility] C-ARM 1-60 MIN NO REPORT    Intraoperative interpretation by procedural physician at [facility].    Standing Status:   Standing    Number of Occurrences:   1    Reason for exam::   Assistance in needle guidance and placement for procedures requiring needle placement in or near specific anatomical locations not easily  accessible without such assistance.   Informed Consent Details: Physician/Practitioner Attestation; Transcribe to consent form and obtain patient signature    Nursing Order: Transcribe to consent form and obtain patient signature. Note: Always confirm laterality of pain with Ms. Milosevic, before procedure.    Physician/Practitioner attestation of informed consent for procedure/surgical case:   I, the physician/practitioner, attest that I have discussed with the patient the benefits, risks, side effects, alternatives, likelihood of achieving goals and potential problems during recovery for the  procedure that I have provided informed consent.    Procedure:   Lumbar Facet Block  under fluoroscopic guidance    Physician/Practitioner performing the procedure:   Elion Hocker A. Tanya MD    Indication/Reason:   Low Back Pain, with our without leg pain, due to Facet Joint Arthralgia (Joint Pain) Spondylosis (Arthritis of the Spine), without myelopathy or radiculopathy (Nerve Damage).   Provide equipment / supplies at bedside    Procedure tray: Block Tray (Disposable  single use) Skin infiltration needle: Regular 1.5-in, 25-G, (x1) Block Needle type: Spinal Amount/quantity: 4 Size: Medium (5-inch) Gauge: 22G    Standing Status:   Standing    Number of Occurrences:   1    Specify:   Block Tray   Saline lock IV    Have LR [id number] mL available and administer at 125 mL/hr if patient becomes hypotensive.    Standing Status:   Standing    Number of Occurrences:   1    Opioid Analgesic: none  MME/day: 00 mg/day   Medications ordered for procedure: Meds ordered this encounter  Medications   lidocaine  (XYLOCAINE ) 2 % (with pres) injection 400 mg   pentafluoroprop-tetrafluoroeth (GEBAUERS) aerosol   midazolam (VERSED) 5 MG/5ML injection 0.5-2 mg    Make sure Flumazenil is available in the pyxis when using this medication. If oversedation occurs, administer 0.2 mg IV over 15 sec. If after 45 sec no response, administer 0.2 mg again over 1 min; may repeat at 1 min intervals; not to exceed 4 doses (1 mg)   fentaNYL  (SUBLIMAZE ) injection 25-50 mcg    Make sure Narcan is available in the pyxis when using this medication. In the event of respiratory depression (RR< 8/min): Titrate NARCAN (naloxone) in increments of 0.1 to 0.2 mg IV at 2-3 minute intervals, until desired degree of reversal.   ropivacaine (PF) 2 mg/mL (0.2%) (NAROPIN) injection 18 mL   triamcinolone  acetonide (KENALOG -40) injection 80 mg   Medications administered: We administered lidocaine , pentafluoroprop-tetrafluoroeth,  midazolam, ropivacaine (PF) 2 mg/mL (0.2%), and triamcinolone  acetonide.  See the medical record for exact dosing, route, and time of administration.    Interventional Therapies  Risk Factors  Considerations  Medical Comorbidities:  ALLERGY: Gabapentin, Codeine, Peanuts, Sulfa  ([place]) (BMI>40)  HTN  IBS  Tobacco abuse  PTSD     Planned  Pending:   Diagnostic bilateral lumbar facet MBB #1 by Tanya MD (02/07/2024)    Under consideration:   Diagnostic bilateral lumbar facet MBB #1    Completed: (Analgesic benefit)1  None at this time   Therapeutic  Palliative (PRN) options:   None established   Completed by other providers:   Therapeutic left US -guided carpal tunnel inj. x1 (11/02/2023) (Hydro-dissection) by Prentice Reges, DO [facility] PMR)  Diagnostic/therapeutic right L5/S1 TFESI x1 (03/06/2020) by Ozell Norleen Matte, MD [facility] PMR)   1(Analgesic benefit): Expressed in percentage (%). (Local anesthetic[LA] +/- sedation  L.A.Local Anesthetic  Steroid benefit  Ongoing benefit)    Follow-up plan:   Return  in about 2 weeks (around 02/21/2024) for (Face2F), (PPE).     Recent Visits Date Type Provider Dept  01/24/24 Office Visit Patel, Seema K, NP [facility]  Showing recent visits within past 90 days and meeting all other requirements Today's Visits Date Type Provider Dept  02/07/24 Procedure visit Tanya Glisson, MD [facility]  Showing today's visits and meeting all other requirements Future Appointments Date Type Provider Dept  03/05/24 Appointment Tanya Glisson, MD [facility]  Showing future appointments within next 90 days and meeting all other requirements   Disposition: Discharge home  Discharge (Date  Time): 02/07/2024; 1209 hrs.   Primary Care Physician: Ostwalt, Janna, PA-C Location: [facility] Note by: Glisson DELENA Tanya, MD (TTS technology used. I apologize for any typographical  errors that were not detected and corrected.) Date: 02/07/2024; Time: 12:12 PM  Disclaimer:  Medicine is not an visual merchandiser. The only guarantee in medicine is that nothing is guaranteed. It is important to note that the decision to proceed with this intervention was based on the information collected from the patient. The Data and conclusions were drawn from the patient's questionnaire, the interview, and the physical examination. Because the information was provided in large part by the patient, it cannot be guaranteed that it has not been purposely or unconsciously manipulated. Every effort has been made to obtain as much relevant data as possible for this evaluation. It is important to note that the conclusions that lead to this procedure are derived in large part from the available data. Always take into account that the treatment will also be dependent on availability of resources and existing treatment guidelines, considered by other Pain Management Practitioners as being common knowledge and practice, at the time of the intervention. For Medico-Legal purposes, it is also important to point out that variation in procedural techniques and pharmacological choices are the acceptable norm. The indications, contraindications, technique, and results of the above procedure should only be interpreted and judged by a Board-Certified Interventional Pain Specialist with extensive familiarity and expertise in the same exact procedure and technique.

## 2024-02-07 NOTE — Patient Instructions (Signed)
 ______________________________________________________________________    Post-Procedure Discharge Instructions  INSTRUCTIONS Apply ice:  Purpose: This will minimize any swelling and discomfort after procedure.  When: Day of procedure, as soon as you get home. How: Fill a plastic sandwich bag with crushed ice. Cover it with a small towel and apply to injection site. How long: (15 min on, 15 min off) Apply for 15 minutes then remove x 15 minutes.  Repeat sequence on day of procedure, until you go to bed. Apply heat:  Purpose: To treat any soreness and discomfort from the procedure. When: Starting the next day after the procedure. How: Apply heat to procedure site starting the day following the procedure. How long: May continue to repeat daily, until discomfort goes away. Food intake: Start with clear liquids (like water) and advance to regular food, as tolerated.  Physical activities: Keep activities to a minimum for the first 8 hours after the procedure. After that, then as tolerated. Driving: If you have received any sedation, be responsible and do not drive. You are not allowed to drive for 24 hours after having sedation. Blood thinner: (Applies only to those taking blood thinners) You may restart your blood thinner 6 hours after your procedure. Insulin: (Applies only to Diabetic patients taking insulin) As soon as you can eat, you may resume your normal dosing schedule. Infection prevention: Keep procedure site clean and dry. Shower daily and clean area with soap and water.  PAIN DIARY Post-procedure Pain Diary: Extremely important that this be done correctly and accurately. Recorded information will be used to determine the next step in treatment. For the purpose of accuracy, follow these rules: Evaluate only the area treated. Do not report or include pain from an untreated area. For the purpose of this evaluation, ignore all other areas of pain, except for the treated area. After your  procedure, avoid taking a long nap and attempting to complete the pain diary after you wake up. Instead, set your alarm clock to go off every hour, on the hour, for the initial 8 hours after the procedure. Document the duration of the numbing medicine, and the relief you are getting from it. Do not go to sleep and attempt to complete it later. It will not be accurate. If you received sedation, it is likely that you were given a medication that may cause amnesia. Because of this, completing the diary at a later time may cause the information to be inaccurate. This information is needed to plan your care. Follow-up appointment: Keep your post-procedure follow-up evaluation appointment after the procedure (usually 2 weeks for most procedures, 6 weeks for radiofrequencies). DO NOT FORGET to bring you pain diary with you.   EXPECT... (What should I expect to see with my procedure?) From numbing medicine (AKA: Local Anesthetics): Numbness or decrease in pain. You may also experience some weakness, which if present, could last for the duration of the local anesthetic. Onset: Full effect within 15 minutes of injected. Duration: It will depend on the type of local anesthetic used. On the average, 1 to 8 hours.  From steroids (Applies only if steroids were used): Decrease in swelling or inflammation. Once inflammation is improved, relief of the pain will follow. Onset of benefits: Depends on the amount of swelling present. The more swelling, the longer it will take for the benefits to be seen. In some cases, up to 10 days. Duration: Steroids will stay in the system x 2 weeks. Duration of benefits will depend on multiple posibilities including persistent irritating  factors. Side-effects: If present, they may typically last 2 weeks (the duration of the steroids). Frequent: Cramps (if they occur, drink Gatorade and take over-the-counter Magnesium 450-500 mg once to twice a day); water retention with temporary weight  gain; increases in blood sugar; decreased immune system response; increased appetite. Occasional: Facial flushing (red, warm cheeks); mood swings; menstrual changes. Uncommon: Long-term decrease or suppression of natural hormones; bone thinning. (These are more common with higher doses or more frequent use. This is why we prefer that our patients avoid having any injection therapies in other practices.)  Very Rare: Severe mood changes; psychosis; aseptic necrosis. From procedure: Some discomfort is to be expected once the numbing medicine wears off. This should be minimal if ice and heat are applied as instructed.  CALL IF... (When should I call?) You experience numbness and weakness that gets worse with time, as opposed to wearing off. New onset bowel or bladder incontinence. (Applies only to procedures done in the spine)  Emergency Numbers: Durning business hours (Monday - Thursday, 8:00 AM - 4:00 PM) (Friday, 9:00 AM - 12:00 Noon): [phone] After hours: [phone] NOTE: If you are having a problem and are unable connect with, or to talk to a provider, then go to your nearest urgent care or emergency department. If the problem is serious and urgent, please call 911. ______________________________________________________________________     ______________________________________________________________________    Steroid injections  Common steroids for injections Triamcinolone : Used by many sports medicine physicians for large joint and bursal injections, often combined with a local anesthetic like lidocaine . A study focusing on coccydynia (tailbone pain) found triamcinolone  was more effective than betamethasone, suggesting it may also be preferable for other localized inflammation conditions. Methylprednisolone: A common alternative to triamcinolone  that is also a strong anti-inflammatory. It is available in different formulations, with the acetate suspension being the long-acting  option for intra-articular injections. Dexamethasone : This is a non-particulate steroid, meaning it has a lower risk of tissue damage compared to particulate steroids like triamcinolone  and methylprednisolone. While less common for this specific use, it is an option for targeted injections.   Considerations for physicians Particulate vs. non-particulate steroids: Triamcinolone  and methylprednisolone are particulate, meaning they can clump together. Dexamethasone  is non-particulate. Particulate steroids are often preferred for their longer-lasting effects but carry a theoretical higher risk for certain injections (though this is less of a concern in the costochondral joints). Combined injectate: Corticosteroids are typically mixed with a local anesthetic like lidocaine  to provide both immediate pain relief (from the anesthetic) and longer-term inflammation reduction (from the steroid). Imaging guidance: To ensure accurate placement of the needle and medication, physicians may use ultrasound or fluoroscopic guidance for the injection, especially in complex or refractory cases.   Patient guidance Before undergoing a steroid injection, discuss the options with your physician. They will determine the best steroid, dosage, and procedure for your specific case based on factors like: Severity of your condition History of response to other treatments Your overall health status Experience and preference of the physician  Last  Updated: 11/15/2023 ______________________________________________________________________

## 2024-02-08 ENCOUNTER — Telehealth: Payer: Self-pay | Admitting: *Deleted

## 2024-02-08 NOTE — Telephone Encounter (Signed)
 Post procedure call:   no  questions or concerns.

## 2024-02-09 ENCOUNTER — Telehealth (INDEPENDENT_AMBULATORY_CARE_PROVIDER_SITE_OTHER): Admitting: Psychiatry

## 2024-02-09 DIAGNOSIS — F4381 Prolonged grief disorder: Secondary | ICD-10-CM | POA: Diagnosis not present

## 2024-02-09 DIAGNOSIS — F411 Generalized anxiety disorder: Secondary | ICD-10-CM

## 2024-02-09 DIAGNOSIS — F314 Bipolar disorder, current episode depressed, severe, without psychotic features: Secondary | ICD-10-CM | POA: Diagnosis not present

## 2024-02-09 MED ORDER — LUMATEPERONE TOSYLATE 42 MG PO CAPS: 42.0000 mg | ORAL_CAPSULE | Freq: Every day | ORAL | 2 refills | Status: AC

## 2024-02-09 NOTE — Progress Notes (Signed)
 [facility] MD/PA/NP OP Progress Note  02/09/2024 1:30 PM TRUE SHACKLEFORD  MRN:  [id number]  Chief Complaint: Routine Follow-up Virtual Visit via Video Note  I connected with Kaylee Mcguire on 02/09/24 at  1:30 PM EST by a video enabled telemedicine application and verified that I am speaking with the correct person using two identifiers.  Location: Patient: [address]  [place] [address]  Provider: [facility] Office of Provider   I discussed the limitations of evaluation and management by telemedicine and the availability of in person appointments. The patient expressed understanding and agreed to proceed.    I discussed the assessment and treatment plan with the patient. The patient was provided an opportunity to ask questions and all were answered. The patient agreed with the plan and demonstrated an understanding of the instructions.   The patient was advised to call back or seek an in-person evaluation if the symptoms worsen or if the condition fails to improve as anticipated.  I provided 30 minutes of non-face-to-face time during this encounter.   Kaylee Jama Der, NP   HPI: 47 year old female presenting to [facility] for follow-up.  Patient reports she is doing well stating that the medications are going well of Caplyta .  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  Patient reports she is doing well stating that she is just trying to take care of her family as well as seeing her partner.  Patient reports the Caplyta  is doing its job and states that her mood and her depression are being well-managed.  Patient with no other question concerns at this time.  Patient is in agreement with treatment plan.  Patient is to follow-up in 2  weeks.  Patient also informed of this provider moving and states she would like to stick with this provider in which patient will transition to the providers next practice.  Based on this assessment patient to follow-up every 2 weeks and is to continue medications.  Patient denies SI, HI, AVH. Visit Diagnosis:    ICD-10-CM   1. Bipolar disorder, current episode depressed, severe, without psychotic features (HCC)  F31.4     2. Anxiety state  F41.1     3. 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       Past Psychiatric History:  Previous Psych Hospitalizations: - Denies Outpatient treatment:  - Current PCP is Benedict Melody, PA-C Medications Current: - Caplyta  42mg  once daily Next Steps: - Evaluate with effectiveness of Vraylar  and monitor symptoms of possible hypomania Medication Trials: - Xanax, side effects and poor response - Abilify, side effects - 10/05/2023 Vraylar  1.5 mg once daily - 10/05/23 Vraylar  1.5 mg once daily, side effects of nausea Suicide & Violence: - Currently denies SI, HI, AVH Substance Use: - Denies Psychotherapy: - [facility]  Legal:  - Denies  Past Medical History:  Past Medical History:  Diagnosis Date   ADHD    ADHD (attention deficit hyperactivity disorder)    Allergy    Anxiety    Arthritis    Brain dysfunction    Carpal tunnel syndrome    Depression    Eczema    Eczema  IBS (irritable bowel syndrome)    Neuromuscular disorder (HCC)    PTSD (post-traumatic stress disorder)    No past surgical history on file.  Family Psychiatric History: No additional  Family History:  Family History  Problem Relation Age of Onset   Aneurysm Mother    Diabetes Mother    Hypotension Mother    Allergies Mother    Food Allergy Mother    Cataracts Mother    COPD Mother    Heart disease Father    Diabetes Father    COPD Father    Hypertension Father    Diabetes Brother    Asthma Daughter    Kidney failure Daughter     Irritable bowel syndrome Daughter    Childhood respiratory disease Daughter    Aneurysm Maternal Grandmother    Heart failure Maternal Grandmother    Cancer Maternal Grandfather    Heart attack Paternal Grandmother    Bladder Cancer Neg Hx    Kidney cancer Neg Hx     Social History:  Social History   Socioeconomic History   Marital status: Significant Other    Spouse name: Not on file   Number of children: 2   Years of education: Not on file   Highest education level: Some college, no degree  Occupational History   Not on file  Tobacco Use   Smoking status: Every Day    Current packs/day: 0.50    Types: Cigarettes   Smokeless tobacco: Never  Vaping Use   Vaping status: Never Used  Substance and Sexual Activity   Alcohol use: Not Currently   Drug use: No   Sexual activity: Yes    Birth control/protection: None  Other Topics Concern   Not on file  Social History Narrative   Not on file   Social Drivers of Health   Financial Resource Strain: Medium Risk (11/09/2023)   Received from [facility]   Overall Financial Resource Strain (CARDIA)    Difficulty of Paying Living Expenses: Somewhat hard  Food Insecurity: Food Insecurity Present (02/02/2024)   Hunger Vital Sign    Worried About Running Out of Food in the Last Year: Often true    Ran Out of Food in the Last Year: Often true  Transportation Needs: No Transportation Needs (01/27/2024)   PRAPARE - Administrator, Civil Service (Medical): No    Lack of Transportation (Non-Medical): No  Physical Activity: Sufficiently Active (11/09/2023)   Received from [facility]   Exercise Vital Sign    On average, how many days per week do you engage in moderate to strenuous exercise (like a brisk walk)?: 7 days    On average, how many minutes do you engage in exercise at this level?: 150+ min  Stress: Stress Concern Present (11/09/2023)   Received from [facility] - Occupational Stress Questionnaire    Feeling of Stress : To some extent  Social Connections: Moderately Integrated (11/09/2023)   Received from [facility]   Social Connection and Isolation Panel    In a typical week, how many times do you talk on the phone with family, friends, or neighbors?: More than three times a week    How often do you get together with friends or relatives?: More than three times a week    How often do you attend church or religious services?: More than 4 times per year  Do you belong to any clubs or organizations such as church groups, unions, fraternal or athletic groups, or school groups?: No    How often do you attend meetings of the clubs or organizations you belong to?: Never    Are you married, widowed, divorced, separated, never married, or living with a partner?: Living with partner    Allergies:  Allergies  Allergen Reactions   Bee Venom Anaphylaxis    Carries Epi Pen    Gabapentin Hives   Codeine    Peanut-Containing Drug Products    Sulfa Antibiotics     Unknown reaction - per pt's mother    Metabolic Disorder Labs: Lab Results  Component Value Date   HGBA1C 5.5 01/27/2023   No results found for: PROLACTIN Lab Results  Component Value Date   CHOL 156 07/20/2023   TRIG 97 07/20/2023   HDL 45 07/20/2023   CHOLHDL 3.5 07/20/2023   LDLCALC 93 07/20/2023   LDLCALC 70 01/27/2023   Lab Results  Component Value Date   TSH 1.490 01/27/2023   TSH 1.24 04/18/2013    Therapeutic Level Labs: No results found for: LITHIUM No results found for: VALPROATE No results found for: CBMZ  Current Medications: Current Outpatient Medications  Medication Sig Dispense Refill   acetaminophen  (TYLENOL ) 500 MG tablet Take 500 mg by mouth 3 (three) times daily.     albuterol  (VENTOLIN  HFA) 108 (90 Base) MCG/ACT inhaler Inhale 2 puffs into the lungs every 6 (six) hours as needed  for wheezing or shortness of breath. 18 g 2   cetirizine  (ZYRTEC ) 10 MG tablet Take 1 tablet (10 mg total) by mouth daily. 30 tablet 11   diclofenac  (VOLTAREN ) 75 MG EC tablet Take 1 tablet (75 mg total) by mouth 2 (two) times daily. 60 tablet 5   Dupilumab  (DUPIXENT ) 300 MG/2ML SOAJ Inject 300 mg into the skin every 14 (fourteen) days. Starting at day 15 for maintenance. 4 mL 5   EPINEPHrine 0.3 mg/0.3 mL IJ SOAJ injection as directed Injection prn for systemic reactions for 30 days     fluticasone -salmeterol (WIXELA INHUB) 100-50 MCG/ACT AEPB Inhale 1 puff into the lungs 2 (two) times daily. (Patient taking differently: Inhale 1 puff into the lungs as needed.) 60 each 1   Hypertonic Nasal Wash (SINUS RINSE BOTTLE KIT) PACK Place 1 each into the nose daily.     ibuprofen  (ADVIL ) 600 MG tablet Take 600 mg by mouth 3 (three) times daily.     ipratropium (ATROVENT ) 0.03 % nasal spray Place 2 sprays into both nostrils every 12 (twelve) hours for 3 days. 30 mL 0   lisinopril  (ZESTRIL ) 5 MG tablet Take 1 tablet (5 mg total) by mouth daily. 90 tablet 3   lumateperone  tosylate (CAPLYTA ) 42 MG capsule Take 1 capsule (42 mg total) by mouth daily. 30 capsule 2   tacrolimus  (PROTOPIC ) 0.1 % ointment Apply topically 2 (two) times daily. (Patient taking differently: 1 Application as needed.) 100 g 0   triamcinolone  cream (KENALOG ) 0.1 % Apply 1 Application topically 2 (two) times daily. (Patient taking differently: Apply 1 Application topically as needed.) 463 g 0   No current facility-administered medications for this visit.     Musculoskeletal: Strength & Muscle Tone: within normal limits Gait & Station: normal Patient leans: N/A   Psychiatric Specialty Exam: Review of Systems  Pt with no vitals at the time, virtual visit.  General Appearance: Well Groomed  Eye Contact:  Good  Speech:  Clear and Coherent  Volume:  Normal  Mood:  Anxious and Depressed  Affect:  Appropriate  Thought Process:   Coherent  Orientation:  Full (Time, Place, and Person)  Thought Content: WDL   Suicidal Thoughts:  No  Homicidal Thoughts:  No  Memory:  Immediate;   Good Recent;   Good Remote;   Good  Judgement:  Good  Insight:  Good  Psychomotor Activity:  Normal  Concentration:  Concentration: Good and Attention Span: Good  Recall:  Good  Fund of Knowledge: Good  Language: Good  Akathisia:  Yes  Handed:  Right  AIMS (if indicated):   Assets:  Desire for Improvement Financial Resources/Insurance Housing  ADL's:  Intact  Cognition: WNL  Sleep:  Good   Screenings: GAD-7    Flowsheet Row Patient Outreach Telephone from 01/27/2024 in [place] [facility] Patient Outreach Telephone from 12/26/2023 in [place] [facility] Office Visit from 10/26/2023 in [facility] Office Visit from 09/07/2023 in [facility] Office Visit from 08/31/2023 in [facility]  Total GAD-7 Score 8 12 7 14 13    PHQ2-9    Flowsheet Row Procedure visit from 02/07/2024 in [facility] Patient Outreach Telephone from 01/27/2024 in [place] POPULATION HEALTH DEPARTMENT Patient Outreach Telephone from 12/26/2023 in [place] POPULATION HEALTH DEPARTMENT Patient Outreach Telephone from 11/23/2023 in [place] POPULATION HEALTH DEPARTMENT Patient Outreach Telephone from 11/02/2023 in [place] POPULATION HEALTH DEPARTMENT  PHQ-2 Total Score 0 1 1 1 1      Assessment and Plan:  Assessment - Diagnosis: Bipolar disorder, current episode depressed, severe, without psychotic features (HCC) [F31.4]  2. Anxiety state [F41.1]  3. 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 - Psychotherapy: [facility]erred to [facility] for [facility] therapy.  - Education: Patient has been educated on how to contact this provider through my chart messaging or by calling the clinic.  Patient also educated on how to when to expect and reply and to give up to 24 hours for the provider to respond to messages sent to the clinic.  Patient has been educated on medications, usage, side effects and adverse reactions. - Follow-Up: Patient will follow up in 2 weeks in person - Referrals: Patient has been referred to [facility] for trauma therapy. - Safety Planning:  The patient has been educated, if they should have suicidal thoughts with or without a plan to call 911, or go to the closest emergency department.  Pt verbalized understanding.  Pt denies firearms within the home.  Pt also agrees to call the clinic should they have worsening symptoms.   Patient/Guardian was advised Release of Information must be obtained prior to any record release in order to collaborate their care with an outside provider. Patient/Guardian was advised if they have not already done so to contact the registration department to sign all necessary forms in order for us  to release information regarding their care.   Consent: Patient/Guardian gives verbal consent for treatment and assignment of benefits for services provided during this visit. Patient/Guardian expressed understanding and agreed to proceed.    Kaylee Jama Der, NP 02/09/2024, 1:30 PM

## 2024-02-22 ENCOUNTER — Other Ambulatory Visit: Payer: Self-pay | Admitting: Physician Assistant

## 2024-02-22 DIAGNOSIS — L308 Other specified dermatitis: Secondary | ICD-10-CM

## 2024-02-27 ENCOUNTER — Other Ambulatory Visit: Payer: Self-pay

## 2024-02-27 NOTE — Patient Outreach (Signed)
 Complex Care Management   Visit Note  02/27/2024  Name:  Kaylee Mcguire MRN: [id number] DOB: 08-14-1975  Situation: Referral received for Complex Care Management related to hypertension I obtained verbal consent from Patient.  Visit completed with Patient  on the phone  Background:   Past Medical History:  Diagnosis Date   ADHD    ADHD (attention deficit hyperactivity disorder)    Allergy    Anxiety    Arthritis    Brain dysfunction    Carpal tunnel syndrome    Depression    Eczema    Eczema    IBS (irritable bowel syndrome)    Neuromuscular disorder (HCC)    PTSD (post-traumatic stress disorder)     Assessment: Patient Reported Symptoms:  Cognitive Cognitive Status: Difficulties with attention and concentration Cognitive/Intellectual Conditions Management [RPT]: Behavior Disorders Behavior Disorders: ADHD   Health Maintenance Behaviors: Annual physical exam Healing Pattern: Slow Health Facilitated by: Rest, Pain control  Neurological Neurological Review of Symptoms: No symptoms reported Neurological Management Strategies: Routine screening Neurological Self-Management Outcome: 4 (good)  HEENT HEENT Symptoms Reported: Other: (Polyp on voice box) HEENT Management Strategies: Coping strategies HEENT Self-Management Outcome: 4 (good)    Cardiovascular Cardiovascular Symptoms Reported: No symptoms reported Does patient have uncontrolled Hypertension?: Yes Is patient checking Blood Pressure at home?: No Cardiovascular Management Strategies: Coping strategies, Medication therapy Weight: 232 lb (105.2 kg) (02-25-2024) Cardiovascular Self-Management Outcome: 4 (good)  Respiratory Respiratory Symptoms Reported: No symptoms reported Respiratory Management Strategies: Adequate rest, Coping strategies, Routine screening Respiratory Self-Management Outcome: 4 (good)  Endocrine Endocrine Symptoms Reported: No symptoms reported Is patient diabetic?: No Endocrine Self-Management  Outcome: 4 (good)  Gastrointestinal Gastrointestinal Symptoms Reported: Reflux/heartburn Additional Gastrointestinal Details: GI appointment on Wednesday Gastrointestinal Management Strategies: Coping strategies Gastrointestinal Self-Management Outcome: 4 (good)    Genitourinary Genitourinary Symptoms Reported: No symptoms reported Genitourinary Management Strategies: Coping strategies Genitourinary Self-Management Outcome: 4 (good)  Integumentary Integumentary Symptoms Reported: No symptoms reported Skin Management Strategies: Coping strategies, Routine screening Skin Self-Management Outcome: 4 (good)  Musculoskeletal Musculoskelatal Symptoms Reviewed: No symptoms reported Musculoskeletal Management Strategies: Coping strategies Musculoskeletal Self-Management Outcome: 4 (good) Falls in the past year?: Yes Number of falls in past year: 1 or less Was there an injury with Fall?: No Fall Risk Category Calculator: 1 Patient Fall Risk Level: Low Fall Risk Patient at Risk for Falls Due to: History of fall(s) Fall risk Follow up: Falls evaluation completed, Education provided, Falls prevention discussed  Psychosocial Psychosocial Symptoms Reported: No symptoms reported Behavioral Health Self-Management Outcome: 4 (good) Techniques to Cope with Loss/Stress/Change: Medication Quality of Family Relationships: helpful, involved, supportive Do you feel physically threatened by others?: No    02/27/2024    PHQ2-9 Depression Screening   Little interest or pleasure in doing things Not at all  Feeling down, depressed, or hopeless Not at all  PHQ-2 - Total Score 0  Trouble falling or staying asleep, or sleeping too much    Feeling tired or having little energy    Poor appetite or overeating     Feeling bad about yourself - or that you are a failure or have let yourself or your family down    Trouble concentrating on things, such as reading the newspaper or watching television    Moving or  speaking so slowly that other people could have noticed.  Or the opposite - being so fidgety or restless that you have been moving around a lot more than usual    Thoughts that you  would be better off dead, or hurting yourself in some way    PHQ2-9 Total Score    If you checked off any problems, how difficult have these problems made it for you to do your work, take care of things at home, or get along with other people    Depression Interventions/Treatment      Today's Vitals   02/27/24 1319  BP: 120/85  Pulse: 80  Weight:    Pain Scale: 0-10 Pain Score: 7  Pain Type: Chronic pain Pain Location: Back Pain Orientation: Lower Pain Descriptors / Indicators: Stabbing Pain Onset: On-going Patients Stated Pain Goal: 0 Pain Intervention(s): Medication (See eMAR)  Medications Reviewed Today     Reviewed by Kay Hendricks MATSU, RN (Case Manager) on 02/27/24 at 1314  Med List Status: <None>   Medication Order Taking? Sig Documenting Provider Last Dose Status Informant  acetaminophen  (TYLENOL ) 500 MG tablet [id number] Yes Take 500 mg by mouth 3 (three) times daily. [provider]  Active   albuterol  (VENTOLIN  HFA) 108 (90 Base) MCG/ACT inhaler [id number] Yes Inhale 2 puffs into the lungs every 6 (six) hours as needed for wheezing or shortness of breath. Ostwalt, Janna, PA-C  Active   cetirizine  (ZYRTEC ) 10 MG tablet [id number] Yes TAKE 1 TABLET BY MOUTH ONCE DAILY Ostwalt, Janna, PA-C  Active   diclofenac  (VOLTAREN ) 75 MG EC tablet [id number] Yes Take 1 tablet (75 mg total) by mouth 2 (two) times daily. Patel, Seema K, NP  Active   Dupilumab  (DUPIXENT ) 300 MG/2ML EMMANUEL [id number] Yes Inject 300 mg into the skin every 14 (fourteen) days. Starting at day 15 for maintenance. Claudene Lehmann, MD  Active   EPINEPHrine 0.3 mg/0.3 mL IJ SOAJ injection [id number] Yes as directed Injection prn for systemic reactions for 30 days [provider]  Active   fluticasone -salmeterol (WIXELA  INHUB) 100-50 MCG/ACT AEPB [id number] Yes Inhale 1 puff into the lungs 2 (two) times daily.  Patient taking differently: Inhale 1 puff into the lungs as needed.   Ostwalt, Janna, PA-C  Active   Hypertonic Nasal Wash (SINUS RINSE BOTTLE KIT) PACK [id number] Yes Place 1 each into the nose daily. Ostwalt, Janna, PA-C  Active   ibuprofen  (ADVIL ) 600 MG tablet [id number] Yes Take 600 mg by mouth 3 (three) times daily. [provider]  Active   ipratropium (ATROVENT ) 0.03 % nasal spray [id number] Yes Place 2 sprays into both nostrils every 12 (twelve) hours for 3 days. Ostwalt, Janna, PA-C  Active   lisinopril  (ZESTRIL ) 5 MG tablet [id number] Yes Take 1 tablet (5 mg total) by mouth daily. Ostwalt, Janna, PA-C  Active   lumateperone  tosylate (CAPLYTA ) 42 MG capsule [id number] Yes Take 1 capsule (42 mg total) by mouth daily. Saucier, Dorn Ruth, NP  Active     Discontinued 01/02/20 1654   tacrolimus  (PROTOPIC ) 0.1 % ointment [id number] Yes Apply topically 2 (two) times daily. Claudene Lehmann, MD  Active     Discontinued 01/02/20 1654   triamcinolone  cream (KENALOG ) 0.1 % [id number] Yes Apply 1 Application topically 2 (two) times daily.  Patient taking differently: Apply 1 Application topically as needed.   Ostwalt, Janna, PA-C  Active             Recommendation:   Continue Current Plan of Care  Follow Up Plan:   Telephone follow-up in 1 month  Hendricks Kay RN, BSN  [place] [facility]-Population Health RN Case Information Systems Manager [phone]

## 2024-02-27 NOTE — Patient Instructions (Signed)
 Visit Information  Ms. Crisco was given information about Medicaid Managed Care team care coordination services as a part of their [employer] Medicaid benefit.   If you would like to schedule transportation through your [facility] plan, please call the following number at least 2 days in advance of your appointment: [phone]  If you are experiencing a behavioral health crisis, call the [employer] [place]  Behavioral Health Crisis Line at [phone] The line is available 24 hours a day, seven days a week.   Please see education materials related to hypertension provided by MyChart link.  Patient verbalizes understanding of instructions and care plan provided today and agrees to view in MyChart. Active MyChart status and patient understanding of how to access instructions and care plan via MyChart confirmed with patient.     Telephone follow up appointment with Managed Medicaid care management team member scheduled for: 03-29-2024 at 10:30 AM   Hendricks Her RN, BSN  [place] [facility]BC[facility]-Population Health RN Case Manager   Direct [phone]   Following is a copy of your plan of care:  Goals Addressed             This Visit's Progress    VBCI RN Care Plan   On track    Problems:  Chronic Disease Management support and education needs related to HTN  Goal: Over the next 90  days the Patient will attend all scheduled medical appointments: with providers   as evidenced by adherence to scheduled appointments         collaborate with the care management team towards completion of advanced directives after discussing with family as evidenced by advanced directive documents in medical record   demonstrate ongoing self health care management ability to monitor for exacerbations of hypertension  as evidenced by taking and recording daily blood pressure UPDATE 02-27-2024  Patient stated she will be more diligent about  taking daily BP and recording  take all medications exactly as prescribed and will call provider for medication related questions as evidenced by contacting provider with any mediation questions or concerns     verbalize understanding of plan for management of  hypertension as evidenced by verbalizing targeted blood pressure goal of <130/90  Coordinate with BSW to address financial housing SDOH needs UPDATE 02-27-2024  Patient has been working with BSW for resources   Interventions:   Hypertension Interventions: Last practice recorded BP readings:  BP Readings from Last 3 Encounters:  01/27/24 (!) 132/95  01/24/24 (!) 132/95  12/26/23 (!) 148/85   Most recent eGFR/CrCl:  Lab Results  Component Value Date   EGFR 100 11/28/2023    No components found for: CRCL  Evaluation of current treatment plan related to hypertension self management and patient's adherence to plan as established by provider Provided education to patient re: stroke prevention, s/s of heart attack and stroke Reviewed medications with patient and discussed importance of compliance Counseled on the importance of exercise goals with target of 150 minutes per week Discussed plans with patient for ongoing care management follow up and provided patient with direct contact information for care management team Advised patient, providing education and rationale, to monitor blood pressure daily and record, calling PCP for findings outside established parameters Reviewed scheduled/upcoming provider appointments including:  Advised patient to discuss any medication concerns with provider Provided education on prescribed diet DASH  Discussed complications of poorly controlled blood pressure such as heart disease, stroke, circulatory complications, vision complications, kidney impairment, sexual dysfunction  Screening for signs and symptoms of depression related to chronic disease state  Assessed social determinant of health  barriers  Patient Self-Care Activities:  Attend all scheduled provider appointments Call pharmacy for medication refills 3-7 days in advance of running out of medications Call provider office for new concerns or questions  Notify RN Care Manager of TOC call rescheduling needs Take medications as prescribed   check blood pressure daily write blood pressure results in a log or diary take blood pressure log to all doctor appointments call doctor for signs and symptoms of high blood pressure keep all doctor appointments take medications for blood pressure exactly as prescribed begin an exercise program eat more whole grains, fruits and vegetables, lean meats and healthy fats limit salt intake to <1500 mg/day  Plan:  Telephone follow up appointment with care management team member scheduled for:  03-29-2024 at 10:30 PM

## 2024-02-28 ENCOUNTER — Other Ambulatory Visit: Payer: Self-pay

## 2024-02-28 DIAGNOSIS — Z1231 Encounter for screening mammogram for malignant neoplasm of breast: Secondary | ICD-10-CM

## 2024-02-28 NOTE — Progress Notes (Unsigned)
 02/28/2024 Kaylee Mcguire [id number] [birth year]  Gastroenterology Office Note    Referring Provider: Dineen Channel, PA-C Primary Care Physician:  Ostwalt, Janna, PA-C  Primary GI Provider: Jinny Carmine, MD    Chief Complaint   No chief complaint on file.    History of Present Illness   Kaylee Mcguire is a 48 y.o. female with PMHX of *** , presenting today at the request of Ostwalt, Janna, PA-C due to IBS??         Past Medical History:  Diagnosis Date   ADHD    ADHD (attention deficit hyperactivity disorder)    Allergy    Anxiety    Arthritis    Brain dysfunction    Carpal tunnel syndrome    Depression    Eczema    Eczema    IBS (irritable bowel syndrome)    Neuromuscular disorder (HCC)    PTSD (post-traumatic stress disorder)     No past surgical history on file.  Current Outpatient Medications  Medication Sig Dispense Refill   acetaminophen  (TYLENOL ) 500 MG tablet Take 500 mg by mouth 3 (three) times daily.     albuterol  (VENTOLIN  HFA) 108 (90 Base) MCG/ACT inhaler Inhale 2 puffs into the lungs every 6 (six) hours as needed for wheezing or shortness of breath. 18 g 2   cetirizine  (ZYRTEC ) 10 MG tablet TAKE 1 TABLET BY MOUTH ONCE DAILY 90 tablet 2   diclofenac  (VOLTAREN ) 75 MG EC tablet Take 1 tablet (75 mg total) by mouth 2 (two) times daily. 60 tablet 5   Dupilumab  (DUPIXENT ) 300 MG/2ML SOAJ Inject 300 mg into the skin every 14 (fourteen) days. Starting at day 15 for maintenance. 4 mL 5   EPINEPHrine 0.3 mg/0.3 mL IJ SOAJ injection as directed Injection prn for systemic reactions for 30 days     fluticasone -salmeterol (WIXELA INHUB) 100-50 MCG/ACT AEPB Inhale 1 puff into the lungs 2 (two) times daily. (Patient taking differently: Inhale 1 puff into the lungs as needed.) 60 each 1   Hypertonic Nasal Wash (SINUS RINSE BOTTLE KIT) PACK Place 1 each into the nose daily.     ibuprofen  (ADVIL ) 600 MG tablet Take 600 mg by mouth 3 (three) times daily.      ipratropium (ATROVENT ) 0.03 % nasal spray Place 2 sprays into both nostrils every 12 (twelve) hours for 3 days. 30 mL 0   lisinopril  (ZESTRIL ) 5 MG tablet Take 1 tablet (5 mg total) by mouth daily. 90 tablet 3   lumateperone  tosylate (CAPLYTA ) 42 MG capsule Take 1 capsule (42 mg total) by mouth daily. 30 capsule 2   tacrolimus  (PROTOPIC ) 0.1 % ointment Apply topically 2 (two) times daily. 100 g 0   triamcinolone  cream (KENALOG ) 0.1 % Apply 1 Application topically 2 (two) times daily. (Patient taking differently: Apply 1 Application topically as needed.) 463 g 0   No current facility-administered medications for this visit.    Allergies as of 02/29/2024 - Review Complete 02/27/2024  Allergen Reaction Noted   Bee venom Anaphylaxis 11/02/2023   Gabapentin Hives 01/21/2020   Codeine  04/18/2013   Peanut-containing drug products  11/02/2023   Sulfa antibiotics  12/05/2023    Family History  Problem Relation Age of Onset   Aneurysm Mother    Diabetes Mother    Hypotension Mother    Allergies Mother    Food Allergy Mother    Cataracts Mother    COPD Mother    Heart disease Father    Diabetes  Father    COPD Father    Hypertension Father    Diabetes Brother    Asthma Daughter    Kidney failure Daughter    Irritable bowel syndrome Daughter    Childhood respiratory disease Daughter    Aneurysm Maternal Grandmother    Heart failure Maternal Grandmother    Cancer Maternal Grandfather    Heart attack Paternal Grandmother    Bladder Cancer Neg Hx    Kidney cancer Neg Hx     Social History   Socioeconomic History   Marital status: Significant Other    Spouse name: Not on file   Number of children: 2   Years of education: Not on file   Highest education level: Some college, no degree  Occupational History   Not on file  Tobacco Use   Smoking status: Every Day    Current packs/day: 0.50    Types: Cigarettes   Smokeless tobacco: Never  Vaping Use   Vaping status: Never Used   Substance and Sexual Activity   Alcohol use: Not Currently   Drug use: No   Sexual activity: Yes    Birth control/protection: None  Other Topics Concern   Not on file  Social History Narrative   Not on file   Social Drivers of Health   Financial Resource Strain: Medium Risk (11/09/2023)   Received from [facility]   Overall Financial Resource Strain (CARDIA)    Difficulty of Paying Living Expenses: Somewhat hard  Food Insecurity: No Food Insecurity (02/27/2024)   Hunger Vital Sign    Worried About Running Out of Food in the Last Year: Never true    Ran Out of Food in the Last Year: Never true  Recent Concern: Food Insecurity - Food Insecurity Present (02/02/2024)   Hunger Vital Sign    Worried About Running Out of Food in the Last Year: Often true    Ran Out of Food in the Last Year: Often true  Transportation Needs: No Transportation Needs (02/27/2024)   PRAPARE - Administrator, Civil Service (Medical): No    Lack of Transportation (Non-Medical): No  Physical Activity: Sufficiently Active (11/09/2023)   Received from [facility]   Exercise Vital Sign    On average, how many days per week do you engage in moderate to strenuous exercise (like a brisk walk)?: 7 days    On average, how many minutes do you engage in exercise at this level?: 150+ min  Stress: Stress Concern Present (11/09/2023)   Received from [facility] - Occupational Stress Questionnaire    Feeling of Stress : To some extent  Social Connections: Moderately Integrated (11/09/2023)   Received from [facility]   Social Connection and Isolation Panel    In a typical week, how many times do you talk on the phone with family, friends, or neighbors?: More than three times a week    How often do you get together with friends or relatives?: More than three times a week    How often do you attend  church or religious services?: More than 4 times per year    Do you belong to any clubs or organizations such as church groups, unions, fraternal or athletic groups, or school groups?: No    How often do you attend meetings of the clubs or organizations you belong to?: Never    Are you married, widowed, divorced, separated, never  married, or living with a partner?: Living with partner  Intimate Partner Violence: Not At Risk (02/27/2024)   Humiliation, Afraid, Rape, and Kick questionnaire    Fear of Current or Ex-Partner: No    Emotionally Abused: No    Physically Abused: No    Sexually Abused: No     RELEVANT GI HISTORY, IMAGING AND LABS: CBC    Component Value Date/Time   WBC 8.9 07/20/2023 1118   WBC 13.8 (H) 04/13/2018 2253   RBC 4.41 07/20/2023 1118   RBC 4.50 04/13/2018 2253   HGB 13.4 07/20/2023 1118   HCT 40.5 07/20/2023 1118   PLT 325 07/20/2023 1118   MCV 92 07/20/2023 1118   MCV 91 06/02/2012 2324   MCH 30.4 07/20/2023 1118   MCH 29.8 04/13/2018 2253   MCHC 33.1 07/20/2023 1118   MCHC 32.4 04/13/2018 2253   RDW 12.7 07/20/2023 1118   RDW 13.8 06/02/2012 2324   LYMPHSABS 1.7 07/20/2023 1118   EOSABS 0.6 (H) 07/20/2023 1118   BASOSABS 0.0 07/20/2023 1118   Recent Labs    07/20/23 1118  HGB 13.4    CMP     Component Value Date/Time   NA 141 11/28/2023 1339   NA 139 06/02/2012 2324   K 4.6 11/28/2023 1339   K 3.9 06/02/2012 2324   CL 107 (H) 11/28/2023 1339   CL 108 (H) 06/02/2012 2324   CO2 19 (L) 11/28/2023 1339   CO2 23 06/02/2012 2324   GLUCOSE 82 11/28/2023 1339   GLUCOSE 102 (H) 04/13/2018 2253   GLUCOSE 91 06/02/2012 2324   BUN 18 11/28/2023 1339   BUN 23 (H) 06/02/2012 2324   CREATININE 0.74 11/28/2023 1339   CREATININE 1.04 06/02/2012 2324   CALCIUM 9.5 11/28/2023 1339   CALCIUM 9.1 06/02/2012 2324   PROT 6.8 11/28/2023 1339   PROT 7.6 06/02/2012 2324   ALBUMIN 4.1 11/28/2023 1339   ALBUMIN 3.7 06/02/2012 2324   AST 14 11/28/2023 1339    AST 14 (L) 06/02/2012 2324   ALT 17 11/28/2023 1339   ALT 19 06/02/2012 2324   ALKPHOS 62 11/28/2023 1339   ALKPHOS 77 06/02/2012 2324   BILITOT <0.2 11/28/2023 1339   BILITOT 0.1 (L) 06/02/2012 2324   GFRNONAA >60 04/13/2018 2253   GFRNONAA >60 06/02/2012 2324   GFRAA >60 04/13/2018 2253   GFRAA >60 06/02/2012 2324      Latest Ref Rng & Units 11/28/2023    1:39 PM 07/20/2023   11:18 AM 01/27/2023    3:43 PM  Hepatic Function  Total Protein 6.0 - 8.5 g/dL 6.8  6.2  4.9   Albumin 3.9 - 4.9 g/dL 4.1  4.1  3.4   AST 0 - 40 IU/L 14  16  20    ALT 0 - 32 IU/L 17  16  21    Alk Phosphatase 44 - 121 IU/L 62  58  62   Total Bilirubin 0.0 - 1.2 mg/dL <9.7  0.3  <9.7       Review of Systems   All systems reviewed and negative except where noted in HPI.    Physical Exam  There were no vitals taken for this visit. No LMP recorded. General:   Alert and oriented. Pleasant and cooperative. Well-nourished and well-developed.  Head:  Normocephalic and atraumatic. Eyes:  Without icterus Ears:  Normal auditory acuity. Neck:  Supple; no masses or thyromegaly. Lungs:  Respirations even and unlabored.  Clear throughout to auscultation.   No wheezes, crackles,  or rhonchi. No acute distress. Heart:  Regular rate and rhythm; no murmurs, clicks, rubs, or gallops. Abdomen:  Normal bowel sounds.  No bruits.  Soft, non-tender and non-distended without masses, hepatosplenomegaly or hernias noted.  No guarding or rebound tenderness.  ***Negative Carnett sign.   Rectal:  Deferred.***  Msk:  Symmetrical without gross deformities. Normal posture. Extremities:  Without edema. Neurologic:  Alert and  oriented x4;  grossly normal neurologically. Skin:  Intact without significant lesions or rashes. Psych:  Alert and cooperative. Normal mood and affect.   Assessment & Plan   Kaylee Mcguire is a 48 y.o. female presenting today with    I discussed the assessment and treatment plan with the patient. The  patient was provided an opportunity to ask questions and all were answered. The patient agreed with the plan and demonstrated an understanding of the instructions.   The patient was advised to call back or seek an in-person evaluation if the symptoms worsen or if the condition fails to improve as anticipated.  Grayce Bohr, DNP, AGNP-C [facility]

## 2024-02-29 ENCOUNTER — Ambulatory Visit: Admitting: Family Medicine

## 2024-02-29 ENCOUNTER — Encounter: Payer: Self-pay | Admitting: Family Medicine

## 2024-02-29 VITALS — BP 136/84 | HR 84 | Temp 98.4°F | Ht 63.0 in | Wt 242.6 lb

## 2024-02-29 DIAGNOSIS — K219 Gastro-esophageal reflux disease without esophagitis: Secondary | ICD-10-CM | POA: Diagnosis not present

## 2024-02-29 DIAGNOSIS — R1312 Dysphagia, oropharyngeal phase: Secondary | ICD-10-CM

## 2024-02-29 DIAGNOSIS — R197 Diarrhea, unspecified: Secondary | ICD-10-CM | POA: Diagnosis not present

## 2024-02-29 DIAGNOSIS — R109 Unspecified abdominal pain: Secondary | ICD-10-CM | POA: Diagnosis not present

## 2024-02-29 DIAGNOSIS — Z1211 Encounter for screening for malignant neoplasm of colon: Secondary | ICD-10-CM | POA: Diagnosis not present

## 2024-02-29 DIAGNOSIS — K2100 Gastro-esophageal reflux disease with esophagitis, without bleeding: Secondary | ICD-10-CM

## 2024-02-29 DIAGNOSIS — R1031 Right lower quadrant pain: Secondary | ICD-10-CM

## 2024-02-29 MED ORDER — NA SULFATE-K SULFATE-MG SULF 17.5-3.13-1.6 GM/177ML PO SOLN: 1.0000 | Freq: Once | ORAL | 0 refills | Status: AC

## 2024-02-29 MED ORDER — PANTOPRAZOLE SODIUM 40 MG PO TBEC: 40.0000 mg | DELAYED_RELEASE_TABLET | Freq: Every day | ORAL | 0 refills | Status: AC

## 2024-02-29 NOTE — Patient Instructions (Signed)
 Drink 64 ounces of Fluids Daily. Start Benefiber Mix 1 TBSP in a drink daily.

## 2024-03-03 LAB — CELIAC DISEASE AB SCREEN W/RFX
Deamidated Gliadin Abs, IgA: 4 U (ref 0–19)
Immunoglobulin A, (IgA) QN, Serum: 291 mg/dL (ref 87–352)
t-Transglutaminase (tTG) IgA: <2 U/mL (ref 0–3)

## 2024-03-03 LAB — TSH RFX ON ABNORMAL TO FREE T4: TSH: 1.12 u[IU]/mL (ref 0.450–4.500)

## 2024-03-04 NOTE — Progress Notes (Unsigned)
 PROVIDER NOTE: Interpretation of information contained herein should be left to medically-trained personnel. Specific patient instructions are provided elsewhere under Patient Instructions section of medical record. This document was created in part using AI and STT-dictation technology, any transcriptional errors that may result from this process are unintentional.  Patient: Kaylee Mcguire  Service: E/M   PCP: Ostwalt, Janna, PA-C  DOB: 1975-06-19  DOS: 03/05/2024  Provider: Eric DELENA Como, MD  MRN: [id number]  Delivery: Face-to-face  Specialty: Interventional Pain Management  Type: Established Patient  Setting: Ambulatory outpatient facility  Specialty designation: 09  Referring Prov.: Ostwalt, Janna, PA-C  Location: Outpatient office facility       History of present illness (HPI) [name], a 48 y.o. year old female, is here today because of her Chronic bilateral low back pain without sciatica [M54.50, G89.29]. Ms. Adamson primary complain today is No chief complaint on file.  Pertinent problems: Ms. 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  Pain Assessment: Severity of   is reported as a  /10. Location:    / . Onset:  . Quality:  . Timing:  . Modifying factor(s):  SABRA Vitals:  vitals were not taken for this visit.  BMI:  Estimated body mass index is 42.97 kg/m as calculated from the following:   Height as of 02/29/24: 5' 3 (1.6 m).   Weight as of 02/29/24: 242 lb 9.6 oz (110 kg).  Last encounter: 02/07/2024. Last procedure: 02/07/2024.  Reason for encounter: post-procedure evaluation and assessment.   Discussed the use of AI scribe software for clinical note transcription with the patient, who gave verbal consent to proceed.  History of Present Illness          Post-Procedure Evaluation   Type: Lumbar Facet, Medial Branch Block(s) (w/ fluoroscopic mapping) #1  Laterality: Bilateral  Level: L2, L3, L4, L5, and S1 Medial Branch/Dorsal Rami Level(s). Injecting these levels blocks the L3-4, L4-5, and L5-S1 lumbar facet joints. Imaging: Fluoroscopic guidance Spinal ([id number]) Anesthesia: Local anesthesia (1-2% Lidocaine ) Anxiolysis: IV Versed  3.0 mg            Sedation: Minimal Sedation None required. No Fentanyl  administered.         DOS: 02/07/2024 Performed by: Eric DELENA Como, MD  Primary Purpose: Diagnostic/Therapeutic Indications: Low back pain severe enough to impact quality of life or function. 1. Chronic low back pain (Bilateral) w/o sciatica   2. Lumbar facet joint pain   3. Lumbar facet joint synovial cyst   4. Lumbar facet joint syndrome   5. Lumbar facet arthropathy (Multilevel) (Bilateral)   6. Spondylosis without myelopathy or radiculopathy, lumbar region   7. Low back pain of over 3 months duration   8. Low back pain radiating to right leg   9. Multifactorial low back pain   10. Abnormal MRI, lumbar spine (02/05/2020) ([facility])    NAS-11 Pain score:   Pre-procedure: 10-Worst pain ever/10   Post-procedure: 0-No pain/10  Effectiveness:  Initial hour after procedure:   ***. Subsequent 4-6 hours post-procedure:   ***. Analgesia past initial 6 hours:   ***. Ongoing improvement:  Analgesic:  *** Function:    ***    ROM:    ***    Interpretation: ***  Pharmacotherapy  Assessment   Opioid Analgesic: none  MME/day: 00 mg/day   Monitoring: [place] PMP: PDMP reviewed during this encounter.       Pharmacotherapy: No side-effects or adverse reactions reported. Compliance: No problems identified. Effectiveness: Clinically acceptable.  No notes on file  UDS:  No results found for: SUMMARY  No results found for: CBDTHCR No results found for: D8THCCBX No results found for: D9THCCBX  ROS  Constitutional: Denies any fever or chills Gastrointestinal: No reported hemesis, hematochezia, vomiting, or acute GI distress Musculoskeletal: Denies any acute onset joint swelling, redness, loss of ROM, or weakness Neurological: No reported episodes of acute onset apraxia, aphasia, dysarthria, agnosia, amnesia, paralysis, loss of coordination, or loss of consciousness  Medication Review  Dupilumab , EPINEPHrine, Sinus Rinse Bottle Kit, acetaminophen , albuterol , cetirizine , diclofenac , fluticasone -salmeterol, ibuprofen , ipratropium, lisinopril , lumateperone  tosylate, pantoprazole , sertraline, tacrolimus , traZODone, and triamcinolone  cream  History Review  Allergy: Ms. Pascual is allergic to bee venom, gabapentin, codeine, peanut-containing drug products, and sulfa antibiotics. Drug: Ms. Skalsky  reports no history of drug use. Alcohol:  reports that she does not currently use alcohol. Tobacco:  reports that she has been smoking cigarettes. She has never used smokeless tobacco. Social: Ms. Liebig  reports that she has been smoking cigarettes. She has never used smokeless tobacco. She reports that she does not currently use alcohol. She reports that she does not use drugs. Medical:  has a past medical history of ADHD, ADHD (attention deficit hyperactivity disorder), Allergy, Anxiety, Arthritis, Brain dysfunction, Carpal tunnel syndrome, Depression, Eczema, Eczema, IBS (irritable bowel syndrome), Neuromuscular disorder (HCC), and PTSD (post-traumatic stress  disorder). Surgical: Ms. Finigan  has no past surgical history on file. Family: family history includes Allergies in her mother; Aneurysm in her maternal grandmother and mother; Asthma in her daughter; COPD in her father and mother; Cancer in her maternal grandfather; Cataracts in her mother; Childhood respiratory disease in her daughter; Diabetes in her brother, father, and mother; Food Allergy in her mother; Heart attack in her paternal grandmother; Heart disease in her father and mother; Heart failure in her maternal grandmother; Hypertension in her father; Hypotension in her mother; Irritable bowel syndrome in her daughter; Kidney failure in her daughter.  Laboratory Chemistry Profile   Renal Lab Results  Component Value Date   BUN 18 11/28/2023   CREATININE 0.74 11/28/2023   BCR 24 (H) 11/28/2023   GFRAA >60 04/13/2018   GFRNONAA >60 04/13/2018    Hepatic Lab Results  Component Value Date   AST 14 11/28/2023   ALT 17 11/28/2023   ALBUMIN 4.1 11/28/2023   ALKPHOS 62 11/28/2023   LIPASE 29 07/20/2016    Electrolytes Lab Results  Component Value Date   NA 141 11/28/2023   K 4.6 11/28/2023   CL 107 (H) 11/28/2023   CALCIUM 9.5 11/28/2023    Bone No results found for: VD25OH, VD125OH2TOT, CI6874NY7, CI7874NY7, 25OHVITD1, 25OHVITD2, 25OHVITD3, TESTOFREE, TESTOSTERONE  Inflammation (CRP: Acute Phase) (ESR: Chronic Phase) No results found for: CRP, ESRSEDRATE, LATICACIDVEN       Note: Above Lab results reviewed.  Recent Imaging Review  DG PAIN CLINIC C-ARM 1-60 MIN NO REPORT Fluoro was used, but no Radiologist interpretation will be provided.  Please  refer to NOTES tab for provider progress note. Note: Reviewed        Physical Exam  Vitals: LMP 02/26/2024  BMI: Estimated body mass index is 42.97 kg/m as calculated from the following:   Height as of 02/29/24: 5' 3 (1.6 m).   Weight as of 02/29/24: 242 lb 9.6 oz (110 kg). Ideal: Ideal body weight:  52.4 kg (115 lb 8.3 oz) Adjusted ideal body weight: 75.5 kg (166 lb 5.6 oz) General appearance: Well nourished, well developed, and well hydrated. In no apparent acute distress Mental status: Alert, oriented x 3 (person, place, & time)       Respiratory: No evidence of acute respiratory distress Eyes: PERLA   Assessment   Diagnosis Status  1. Chronic low back pain (Bilateral) w/o sciatica   2. Lumbar facet joint pain   3. Lumbar facet joint synovial cyst   4. Postop check    Controlled Controlled Controlled   Updated Problems: No problems updated.  Plan of Care  Problem-specific:  Assessment and Plan            Ms. NATAYA BASTEDO has a current medication list which includes the following long-term medication(s): albuterol , cetirizine , fluticasone -salmeterol, sinus rinse bottle kit, ipratropium, lisinopril , pantoprazole , [DISCONTINUED] sertraline, and [DISCONTINUED] trazodone.  Pharmacotherapy (Medications Ordered): No orders of the defined types were placed in this encounter.  Orders:  No orders of the defined types were placed in this encounter.    Interventional Therapies  Risk Factors  Considerations  Medical Comorbidities:  ALLERGY: Gabapentin, Codeine, Peanuts, Sulfa  ([place]) (BMI>40)  HTN  IBS  Tobacco abuse  PTSD     Planned  Pending:   Diagnostic bilateral lumbar facet MBB #1 by Tanya MD (02/07/2024)    Under consideration:   Diagnostic bilateral lumbar facet MBB #1    Completed: (Analgesic benefit)1  None at this time   Therapeutic  Palliative (PRN) options:   None established   Completed by other providers:   Therapeutic left US -guided carpal tunnel inj. x1 (11/02/2023) (Hydro-dissection) by Prentice Reges, DO [facility] PMR)  Diagnostic/therapeutic right L5/S1 TFESI x1 (03/06/2020) by Ozell Norleen Matte, MD [facility] PMR)   1(Analgesic benefit): Expressed in percentage (%). (Local anesthetic[LA] +/- sedation  L.A.Local Anesthetic  Steroid benefit   Ongoing benefit)   No follow-ups on file.    Recent Visits Date Type Provider Dept  02/07/24 Procedure visit Tanya Glisson, MD [facility]  01/24/24 Office Visit Patel, Seema K, NP [facility]  Showing recent visits within past 90 days and meeting all other requirements Future Appointments Date Type Provider Dept  03/05/24 Appointment Tanya Glisson, MD [facility]  Showing future appointments within next 90 days and meeting all other requirements  I discussed the assessment and treatment plan with the patient. The patient was provided an opportunity to ask questions and all were answered. The patient agreed with the plan and demonstrated an understanding of the instructions.  Patient advised to call back or seek an in-person evaluation if the symptoms or condition worsens.  Duration of encounter: *** minutes.  Total time on encounter, as per AMA guidelines included both the face-to-face and non-face-to-face time personally spent by the physician and/or other qualified health care professional(s) on the day of the encounter (includes time in activities that require the physician or other qualified health care professional and does not include time in activities normally performed by clinical staff). Physician's time may include the following activities when performed: Preparing to see  the patient (e.g., pre-charting review of records, searching for previously ordered imaging, lab work, and nerve conduction tests) Review of prior analgesic pharmacotherapies. Reviewing PMP Interpreting ordered tests (e.g., lab work, imaging, nerve conduction tests) Performing post-procedure evaluations, including interpretation of diagnostic procedures Obtaining and/or reviewing separately obtained history Performing a medically appropriate examination and/or evaluation Counseling and educating the patient/family/caregiver Ordering medications, tests, or  procedures Referring and communicating with other health care professionals (when not separately reported) Documenting clinical information in the electronic or other health record Independently interpreting results (not separately reported) and communicating results to the patient/ family/caregiver Care coordination (not separately reported)  Note by: Eric DELENA Como, MD (TTS and AI technology used. I apologize for any typographical errors that were not detected and corrected.) Date: 03/05/2024; Time: 12:30 PM

## 2024-03-05 ENCOUNTER — Encounter: Payer: Self-pay | Admitting: Pain Medicine

## 2024-03-05 ENCOUNTER — Ambulatory Visit: Attending: Pain Medicine | Admitting: Pain Medicine

## 2024-03-05 VITALS — BP 140/90 | HR 76 | Temp 97.9°F | Resp 16 | Ht 63.0 in | Wt 236.0 lb

## 2024-03-05 DIAGNOSIS — M79604 Pain in right leg: Secondary | ICD-10-CM | POA: Insufficient documentation

## 2024-03-05 DIAGNOSIS — M545 Low back pain, unspecified: Secondary | ICD-10-CM | POA: Diagnosis not present

## 2024-03-05 DIAGNOSIS — M5459 Other low back pain: Secondary | ICD-10-CM | POA: Insufficient documentation

## 2024-03-05 DIAGNOSIS — G8929 Other chronic pain: Secondary | ICD-10-CM | POA: Diagnosis not present

## 2024-03-05 DIAGNOSIS — Z09 Encounter for follow-up examination after completed treatment for conditions other than malignant neoplasm: Secondary | ICD-10-CM

## 2024-03-05 DIAGNOSIS — M7138 Other bursal cyst, other site: Secondary | ICD-10-CM | POA: Diagnosis not present

## 2024-03-05 NOTE — Patient Instructions (Signed)

## 2024-03-05 NOTE — Progress Notes (Signed)
 Safety precautions to be maintained throughout the outpatient stay will include: orient to surroundings, keep bed in low position, maintain call bell within reach at all times, provide assistance with transfer out of bed and ambulation.

## 2024-03-06 ENCOUNTER — Ambulatory Visit: Payer: Self-pay | Admitting: Family Medicine

## 2024-03-07 LAB — CALPROTECTIN, FECAL: Calprotectin, Fecal: 45 ug/g (ref 0–120)

## 2024-03-08 ENCOUNTER — Encounter: Payer: Self-pay | Admitting: Gastroenterology

## 2024-03-08 ENCOUNTER — Ambulatory Visit
Admission: RE | Admit: 2024-03-08 | Discharge: 2024-03-08 | Disposition: A | Discharge status: Home / Self Care | Attending: Gastroenterology | Admitting: Gastroenterology

## 2024-03-08 ENCOUNTER — Ambulatory Visit: Admitting: Anesthesiology

## 2024-03-08 ENCOUNTER — Other Ambulatory Visit: Payer: Self-pay

## 2024-03-08 ENCOUNTER — Encounter: Admission: RE | Disposition: A | Payer: Self-pay | Source: Home / Self Care | Attending: Gastroenterology

## 2024-03-08 DIAGNOSIS — K219 Gastro-esophageal reflux disease without esophagitis: Secondary | ICD-10-CM | POA: Insufficient documentation

## 2024-03-08 DIAGNOSIS — E66813 Obesity, class 3: Secondary | ICD-10-CM | POA: Insufficient documentation

## 2024-03-08 DIAGNOSIS — Z1211 Encounter for screening for malignant neoplasm of colon: Secondary | ICD-10-CM | POA: Insufficient documentation

## 2024-03-08 DIAGNOSIS — K635 Polyp of colon: Secondary | ICD-10-CM

## 2024-03-08 DIAGNOSIS — Z791 Long term (current) use of non-steroidal anti-inflammatories (NSAID): Secondary | ICD-10-CM | POA: Insufficient documentation

## 2024-03-08 DIAGNOSIS — F17210 Nicotine dependence, cigarettes, uncomplicated: Secondary | ICD-10-CM | POA: Insufficient documentation

## 2024-03-08 DIAGNOSIS — F32A Depression, unspecified: Secondary | ICD-10-CM | POA: Diagnosis not present

## 2024-03-08 DIAGNOSIS — R1310 Dysphagia, unspecified: Secondary | ICD-10-CM | POA: Diagnosis not present

## 2024-03-08 DIAGNOSIS — Z79899 Other long term (current) drug therapy: Secondary | ICD-10-CM | POA: Insufficient documentation

## 2024-03-08 DIAGNOSIS — Z6841 Body Mass Index (BMI) 40.0 and over, adult: Secondary | ICD-10-CM | POA: Diagnosis not present

## 2024-03-08 DIAGNOSIS — F419 Anxiety disorder, unspecified: Secondary | ICD-10-CM | POA: Diagnosis not present

## 2024-03-08 DIAGNOSIS — D120 Benign neoplasm of cecum: Secondary | ICD-10-CM | POA: Diagnosis not present

## 2024-03-08 DIAGNOSIS — Z7951 Long term (current) use of inhaled steroids: Secondary | ICD-10-CM | POA: Insufficient documentation

## 2024-03-08 DIAGNOSIS — I10 Essential (primary) hypertension: Secondary | ICD-10-CM | POA: Insufficient documentation

## 2024-03-08 DIAGNOSIS — K2100 Gastro-esophageal reflux disease with esophagitis, without bleeding: Secondary | ICD-10-CM

## 2024-03-08 HISTORY — PX: COLONOSCOPY: SHX5424

## 2024-03-08 HISTORY — PX: RECTAL BIOPSY: SHX2303

## 2024-03-08 HISTORY — PX: POLYPECTOMY: SHX149

## 2024-03-08 HISTORY — PX: ESOPHAGOGASTRODUODENOSCOPY: SHX5428

## 2024-03-08 SURGERY — COLONOSCOPY
Anesthesia: General

## 2024-03-08 MED ORDER — DEXMEDETOMIDINE HCL IN NACL 80 MCG/20ML IV SOLN
INTRAVENOUS | Status: DC | PRN
  Administered 2024-03-08: 10:00:00 8 ug via INTRAVENOUS
  Administered 2024-03-08: 10:00:00 12 ug via INTRAVENOUS

## 2024-03-08 MED ORDER — SODIUM CHLORIDE 0.9 % IV SOLN: INTRAVENOUS | Status: DC

## 2024-03-08 MED ADMIN — PROPOFOL 200 MG/20ML IV EMUL: 50 mg | INTRAVENOUS | @ 11:00:00 | NDC 00069020910

## 2024-03-08 MED ADMIN — Lidocaine HCl(Cardiac) IV PF Soln Pref Syr 100 MG/5ML (2%): 80 mg | INTRAVENOUS | @ 11:00:00 | NDC 00409132305

## 2024-03-08 MED ADMIN — Glycopyrrolate Inj 0.2 MG/ML: .2 mg | INTRAVENOUS | @ 10:00:00 | NDC 00143968225

## 2024-03-08 MED ADMIN — Propofol IV Emul 500 MG/50ML (10 MG/ML): 75 ug/kg/min | INTRAVENOUS | @ 11:00:00 | NDC 00069023420

## 2024-03-08 MED FILL — Glycopyrrolate Inj 0.2 MG/ML: INTRAMUSCULAR | Qty: 1 | Status: AC

## 2024-03-08 NOTE — Op Note (Signed)
 [facility] Gastroenterology Patient Name: Kaylee Mcguire Procedure Date: 03/08/2024 10:22 AM MRN: [id number] Account #: [id number] Date of Birth: [birth year] Admit Type: Outpatient Age: 48 Room: [facility] ENDO ROOM 4 Gender: Female Note Status: Finalized Instrument Name: Upper GI Scope [id number] Procedure:             Upper GI endoscopy Indications:           Dysphagia Providers:             Rogelia Copping MD, MD Referring MD:          Jolynn Spencer (Referring MD) Medicines:             Propofol  per Anesthesia Complications:         No immediate complications. Procedure:             Pre-Anesthesia Assessment:                        - Prior to the procedure, a History and Physical was                         performed, and patient medications and allergies were                         reviewed. The patient's tolerance of previous                         anesthesia was also reviewed. The risks and benefits                         of the procedure and the sedation options and risks                         were discussed with the patient. All questions were                         answered, and informed consent was obtained. Prior                         Anticoagulants: The patient has taken no anticoagulant                         or antiplatelet agents. ASA Grade Assessment: II - A                         patient with mild systemic disease. After reviewing                         the risks and benefits, the patient was deemed in                         satisfactory condition to undergo the procedure.                        After obtaining informed consent, the endoscope was                         passed under direct vision. Throughout the procedure,  the patient's blood pressure, pulse, and oxygen                         saturations were monitored continuously. The Endoscope                         was introduced through the mouth, and advanced to the                          second part of duodenum. The upper GI endoscopy was                         accomplished without difficulty. The patient tolerated                         the procedure well. Findings:      The examined esophagus was normal. A TTS dilator was passed through the       scope. Dilation with a 15-16.5-18 mm balloon dilator was performed to 18       mm. The dilation site was examined following endoscope reinsertion and       showed no change. Two biopsies were obtained in the middle third of the       esophagus with cold forceps for histology.      The stomach was normal.      The examined duodenum was normal. Impression:            - Normal esophagus. Dilated.                        - Normal stomach.                        - Normal examined duodenum.                        - Two biopsies were obtained in the middle third of                         the esophagus. Recommendation:        - Discharge patient to home.                        - Resume previous diet.                        - Continue present medications.                        - Await pathology results.                        - Perform a colonoscopy today. Procedure Code(s):     --- Professional ---                        [id number], Esophagogastroduodenoscopy, flexible,                         transoral; with transendoscopic balloon dilation of  esophagus (less than 30 mm diameter)                        43239, 59, Esophagogastroduodenoscopy, flexible,                         transoral; with biopsy, single or multiple Diagnosis Code(s):     --- Professional ---                        R13.10, Dysphagia, unspecified CPT copyright 2022 American Medical Association. All rights reserved. The codes documented in this report are preliminary and upon coder review may  be revised to meet current compliance requirements. Rogelia Copping MD, MD 03/08/2024 10:39:04 AM This report has been signed  electronically. Number of Addenda: 0 Note Initiated On: 03/08/2024 10:22 AM Estimated Blood Loss:  Estimated blood loss: none.      [facility]

## 2024-03-08 NOTE — Transfer of Care (Signed)
 Immediate Anesthesia Transfer of Care Note  Patient: Kaylee Mcguire  Procedure(s) Performed: COLONOSCOPY EGD (ESOPHAGOGASTRODUODENOSCOPY) POLYPECTOMY, INTESTINE  Patient Location: PACU  Anesthesia Type:General  Level of Consciousness: sedated  Airway & Oxygen Therapy: Patient Spontanous Breathing  Post-op Assessment: Report given to RN and Post -op Vital signs reviewed and stable  Post vital signs: Reviewed and stable  Last Vitals:  Vitals Value Taken Time  BP 135/88 03/08/24 10:52  Temp    Pulse 84 03/08/24 10:52  Resp 17 03/08/24 10:52  SpO2 100 % 03/08/24 10:52  Vitals shown include unfiled device data.  Last Pain:  Vitals:   03/08/24 0948  TempSrc: Temporal  PainSc: 0-No pain         Complications: No notable events documented.

## 2024-03-08 NOTE — Anesthesia Preprocedure Evaluation (Signed)
 Anesthesia Evaluation  Patient identified by MRN, date of birth, ID band Patient awake    Reviewed: Allergy & Precautions, H&P , NPO status , Patient's Chart, lab work & pertinent test results, reviewed documented beta blocker date and time   Airway Mallampati: II   Neck ROM: full    Dental  (+) Poor Dentition   Pulmonary neg pulmonary ROS, Current Smoker   Pulmonary exam normal        Cardiovascular Exercise Tolerance: Good hypertension, Normal cardiovascular exam Rhythm:regular Rate:Normal     Neuro/Psych   Anxiety Depression     Neuromuscular disease  negative psych ROS   GI/Hepatic negative GI ROS, Neg liver ROS,,,  Endo/Other    Class 3 obesity  Renal/GU negative Renal ROS  negative genitourinary   Musculoskeletal   Abdominal   Peds  Hematology negative hematology ROS (+)   Anesthesia Other Findings Past Medical History: No date: ADHD No date: ADHD (attention deficit hyperactivity disorder) No date: Allergy No date: Anxiety No date: Arthritis No date: Brain dysfunction No date: Carpal tunnel syndrome No date: Depression No date: Eczema No date: Eczema No date: IBS (irritable bowel syndrome) No date: Neuromuscular disorder (HCC) No date: PTSD (post-traumatic stress disorder) No past surgical history on file.   Reproductive/Obstetrics negative OB ROS                              Anesthesia Physical Anesthesia Plan  ASA: 3  Anesthesia Plan: General   Post-op Pain Management:    Induction:   PONV Risk Score and Plan:   Airway Management Planned:   Additional Equipment:   Intra-op Plan:   Post-operative Plan:   Informed Consent: I have reviewed the patients History and Physical, chart, labs and discussed the procedure including the risks, benefits and alternatives for the proposed anesthesia with the patient or authorized representative who has indicated his/her  understanding and acceptance.     Dental Advisory Given  Plan Discussed with: CRNA  Anesthesia Plan Comments:         Anesthesia Quick Evaluation

## 2024-03-08 NOTE — Anesthesia Postprocedure Evaluation (Signed)
 Anesthesia Post Note  Patient: Kaylee Mcguire  Procedure(s) Performed: COLONOSCOPY EGD (ESOPHAGOGASTRODUODENOSCOPY) POLYPECTOMY, INTESTINE  Patient location during evaluation: PACU Anesthesia Type: General Level of consciousness: awake and alert Pain management: pain level controlled Vital Signs Assessment: post-procedure vital signs reviewed and stable Respiratory status: spontaneous breathing, nonlabored ventilation, respiratory function stable and patient connected to nasal cannula oxygen Cardiovascular status: blood pressure returned to baseline and stable Postop Assessment: no apparent nausea or vomiting Anesthetic complications: no   No notable events documented.   Last Vitals:  Vitals:   03/08/24 0948 03/08/24 1052  BP: 136/89 135/88  Pulse: 68 84  Resp: 16 17  Temp: (!) 35.6 C   SpO2: 100% 100%    Last Pain:  Vitals:   03/08/24 0948  TempSrc: Temporal  PainSc: 0-No pain                 Lynwood KANDICE Clause

## 2024-03-08 NOTE — Op Note (Signed)
 [facility] Gastroenterology Patient Name: Kaylee Mcguire Procedure Date: 03/08/2024 10:21 AM MRN: [id number] Account #: [id number] Date of Birth: [birth year] Admit Type: Outpatient Age: 48 Room: [facility] ENDO ROOM 4 Gender: Female Note Status: Finalized Instrument Name: Colon Scope [id number] Procedure:             Colonoscopy Indications:           Screening for colorectal malignant neoplasm Providers:             Rogelia Copping MD, MD Referring MD:          Jolynn Spencer (Referring MD) Medicines:             Propofol  per Anesthesia Complications:         No immediate complications. Procedure:             Pre-Anesthesia Assessment:                        - Prior to the procedure, a History and Physical was                         performed, and patient medications and allergies were                         reviewed. The patient's tolerance of previous                         anesthesia was also reviewed. The risks and benefits                         of the procedure and the sedation options and risks                         were discussed with the patient. All questions were                         answered, and informed consent was obtained. Prior                         Anticoagulants: The patient has taken no anticoagulant                         or antiplatelet agents. ASA Grade Assessment: II - A                         patient with mild systemic disease. After reviewing                         the risks and benefits, the patient was deemed in                         satisfactory condition to undergo the procedure.                        After obtaining informed consent, the colonoscope was                         passed under direct vision. Throughout the procedure,  the patient's blood pressure, pulse, and oxygen                         saturations were monitored continuously. The                         Colonoscope was introduced through the  anus and                         advanced to the the cecum, identified by appendiceal                         orifice and ileocecal valve. The colonoscopy was                         performed without difficulty. The patient tolerated                         the procedure well. The quality of the bowel                         preparation was excellent. Findings:      The perianal and digital rectal examinations were normal.      A 4 mm polyp was found in the cecum. The polyp was sessile. The polyp       was removed with a cold snare. Resection and retrieval were complete. Impression:            - One 4 mm polyp in the cecum, removed with a cold                         snare. Resected and retrieved. Recommendation:        - Discharge patient to home.                        - Resume previous diet.                        - Continue present medications.                        - Await pathology results.                        - If the pathology report reveals adenomatous tissue,                         then repeat the colonoscopy for surveillance in 7                         years. Procedure Code(s):     --- Professional ---                        [phone], Colonoscopy, flexible; with removal of                         tumor(s), polyp(s), or other lesion(s) by snare                         technique Diagnosis  Code(s):     --- Professional ---                        Z12.11, Encounter for screening for malignant neoplasm                         of colon                        D12.0, Benign neoplasm of cecum CPT copyright 2022 American Medical Association. All rights reserved. The codes documented in this report are preliminary and upon coder review may  be revised to meet current compliance requirements. Rogelia Copping MD, MD 03/08/2024 10:53:32 AM This report has been signed electronically. Number of Addenda: 0 Note Initiated On: 03/08/2024 10:21 AM Scope Withdrawal Time: 0 hours 6 minutes 20  seconds  Total Procedure Duration: 0 hours 8 minutes 26 seconds  Estimated Blood Loss:  Estimated blood loss: none.      [facility]

## 2024-03-08 NOTE — H&P (Signed)
 Kaylee Copping, MD [facility] [address], [address] [place], [place] [address] [phone] Fax : [phone]  Primary Care Physician:  Dineen Channel, PA-C Primary Gastroenterologist:  Dr. Copping  Pre-Procedure History & Physical: HPI:  Kaylee Mcguire is a 48 y.o. female is here for an endoscopy and colonoscopy.   Past Medical History:  Diagnosis Date   ADHD    ADHD (attention deficit hyperactivity disorder)    Allergy    Anxiety    Arthritis    Brain dysfunction    Carpal tunnel syndrome    Depression    Eczema    Eczema    IBS (irritable bowel syndrome)    Neuromuscular disorder (HCC)    PTSD (post-traumatic stress disorder)     History reviewed. No pertinent surgical history.  Prior to Admission medications  Medication Sig Start Date End Date Taking? Authorizing Provider  acetaminophen  (TYLENOL ) 500 MG tablet Take 500 mg by mouth 3 (three) times daily.   Yes [provider]  cetirizine  (ZYRTEC ) 10 MG tablet TAKE 1 TABLET BY MOUTH ONCE DAILY 02/22/24  Yes Ostwalt, Janna, PA-C  diclofenac  (VOLTAREN ) 75 MG EC tablet Take 1 tablet (75 mg total) by mouth 2 (two) times daily. 01/24/24  Yes Patel, Seema K, NP  ibuprofen  (ADVIL ) 600 MG tablet Take 600 mg by mouth 3 (three) times daily.   Yes [provider]  lisinopril  (ZESTRIL ) 5 MG tablet Take 1 tablet (5 mg total) by mouth daily. 02/24/23  Yes Ostwalt, Janna, PA-C  pantoprazole  (PROTONIX ) 40 MG tablet Take 1 tablet (40 mg total) by mouth daily. 02/29/24  Yes Celestia Rima, NP  albuterol  (VENTOLIN  HFA) 108 (90 Base) MCG/ACT inhaler Inhale 2 puffs into the lungs every 6 (six) hours as needed for wheezing or shortness of breath. 07/20/23   Ostwalt, Janna, PA-C  Dupilumab  (DUPIXENT ) 300 MG/2ML SOAJ Inject 300 mg into the skin every 14 (fourteen) days. Starting at day 15 for maintenance. 09/12/23   Claudene Lehmann, MD  EPINEPHrine 0.3 mg/0.3 mL IJ SOAJ injection as directed Injection prn for systemic reactions for  30 days 03/24/23   [provider]  fluticasone -salmeterol (WIXELA INHUB) 100-50 MCG/ACT AEPB Inhale 1 puff into the lungs 2 (two) times daily. Patient taking differently: Inhale 1 puff into the lungs as needed. 10/26/23   Ostwalt, Janna, PA-C  Hypertonic Nasal Wash (SINUS RINSE BOTTLE KIT) PACK Place 1 each into the nose daily. 02/02/24   Ostwalt, Janna, PA-C  ipratropium (ATROVENT ) 0.03 % nasal spray Place 2 sprays into both nostrils every 12 (twelve) hours for 3 days. 07/22/23 03/05/24  Ostwalt, Janna, PA-C  lumateperone  tosylate (CAPLYTA ) 42 MG capsule Take 1 capsule (42 mg total) by mouth daily. 02/09/24   Saucier, Dorn Ruth, NP  tacrolimus  (PROTOPIC ) 0.1 % ointment Apply topically 2 (two) times daily. 06/20/23   Claudene Lehmann, MD  triamcinolone  cream (KENALOG ) 0.1 % Apply 1 Application topically 2 (two) times daily. Patient taking differently: Apply 1 Application topically as needed. 02/24/23   Ostwalt, Janna, PA-C  sertraline (ZOLOFT) 50 MG tablet Take 50 mg by mouth daily.  01/02/20  [provider]  traZODone (DESYREL) 100 MG tablet Take 100 mg by mouth at bedtime.  01/02/20  [provider]    Allergies as of 02/29/2024 - Review Complete 02/29/2024  Allergen Reaction Noted   Bee venom Anaphylaxis 11/02/2023   Gabapentin Hives 01/21/2020   Codeine  04/18/2013   Peanut-containing drug products  11/02/2023   Sulfa antibiotics  12/05/2023  Family History  Problem Relation Age of Onset   Heart disease Mother    Aneurysm Mother    Diabetes Mother    Hypotension Mother    Allergies Mother    Food Allergy Mother    Cataracts Mother    COPD Mother    Heart disease Father    Diabetes Father    COPD Father    Hypertension Father    Diabetes Brother    Aneurysm Maternal Grandmother    Heart failure Maternal Grandmother    Cancer Maternal Grandfather    Heart attack Paternal Grandmother    Asthma Daughter    Kidney failure Daughter    Irritable  bowel syndrome Daughter    Childhood respiratory disease Daughter    Bladder Cancer Neg Hx    Kidney cancer Neg Hx     Social History   Socioeconomic History   Marital status: Significant Other    Spouse name: Not on file   Number of children: 2   Years of education: Not on file   Highest education level: Some college, no degree  Occupational History   Not on file  Tobacco Use   Smoking status: Every Day    Current packs/day: 0.50    Types: Cigarettes   Smokeless tobacco: Never  Vaping Use   Vaping status: Never Used  Substance and Sexual Activity   Alcohol use: Not Currently   Drug use: No   Sexual activity: Yes    Birth control/protection: None  Other Topics Concern   Not on file  Social History Narrative   Not on file   Social Drivers of Health   Tobacco Use: High Risk (03/08/2024)   Patient History    Smoking Tobacco Use: Every Day    Smokeless Tobacco Use: Never    Passive Exposure: Not on file  Financial Resource Strain: Medium Risk (11/09/2023)   Received from [facility]   Overall Financial Resource Strain (CARDIA)    Difficulty of Paying Living Expenses: Somewhat hard  Food Insecurity: No Food Insecurity (02/27/2024)   Epic    Worried About Programme Researcher, Broadcasting/film/video in the Last Year: Never true    Ran Out of Food in the Last Year: Never true  Recent Concern: Food Insecurity - Food Insecurity Present (02/02/2024)   Epic    Worried About Programme Researcher, Broadcasting/film/video in the Last Year: Often true    Ran Out of Food in the Last Year: Often true  Transportation Needs: No Transportation Needs (02/27/2024)   Epic    Lack of Transportation (Medical): No    Lack of Transportation (Non-Medical): No  Physical Activity: Sufficiently Active (11/09/2023)   Received from [facility]   Exercise Vital Sign    On average, how many days per week do you engage in moderate to strenuous exercise (like a brisk walk)?: 7 days    On average, how many  minutes do you engage in exercise at this level?: 150+ min  Stress: Stress Concern Present (11/09/2023)   Received from [facility] - Occupational Stress Questionnaire    Feeling of Stress : To some extent  Social Connections: Moderately Integrated (11/09/2023)   Received from [facility]   Social Connection and Isolation Panel    In a typical week, how many times do you talk on the phone with family, friends, or neighbors?: More than three times a week  How often do you get together with friends or relatives?: More than three times a week    How often do you attend church or religious services?: More than 4 times per year    Do you belong to any clubs or organizations such as church groups, unions, fraternal or athletic groups, or school groups?: No    How often do you attend meetings of the clubs or organizations you belong to?: Never    Are you married, widowed, divorced, separated, never married, or living with a partner?: Living with partner  Intimate Partner Violence: Not At Risk (02/27/2024)   Epic    Fear of Current or Ex-Partner: No    Emotionally Abused: No    Physically Abused: No    Sexually Abused: No  Depression (PHQ2-9): Low Risk (03/05/2024)   Depression (PHQ2-9)    PHQ-2 Score: 0  Alcohol Screen: Low Risk (04/07/2023)   Alcohol Screen    Last Alcohol Screening Score (AUDIT): 0  Housing: High Risk (02/27/2024)   Epic    Unable to Pay for Housing in the Last Year: No    Number of Times Moved in the Last Year: 1    Homeless in the Last Year: Yes  Utilities: Not At Risk (02/27/2024)   Epic    Threatened with loss of utilities: No  Health Literacy: Inadequate Health Literacy (11/09/2023)   Received from [facility]   [phone] Health Literacy    How often do you need to have someone help you when you read instructions, pamphlets, or other written material from your doctor or  pharmacy?: Sometimes    Review of Systems: See HPI, otherwise negative ROS  Physical Exam: BP 136/89   Pulse 68   Temp (!) 96.1 F (35.6 C) (Temporal)   Resp 16   Wt 107.5 kg   LMP 02/26/2024 Comment: 03/08/24 - not able to provide a sample, not able to void at this time  SpO2 100%   BMI 41.98 kg/m  General:   Alert,  pleasant and cooperative in NAD Head:  Normocephalic and atraumatic. Neck:  Supple; no masses or thyromegaly. Lungs:  Clear throughout to auscultation.    Heart:  Regular rate and rhythm. Abdomen:  Soft, nontender and nondistended. Normal bowel sounds, without guarding, and without rebound.   Neurologic:  Alert and  oriented x4;  grossly normal neurologically.  Impression/Plan: VALLA PACEY is here for an endoscopy and colonoscopy to be performed for dysphagia and screening  Risks, benefits, limitations, and alternatives regarding  endoscopy and colonoscopy have been reviewed with the patient.  Questions have been answered.  All parties agreeable.   Kaylee Copping, MD  03/08/2024, 10:20 AM

## 2024-03-08 NOTE — OR Nursing (Signed)
 Pt not able to provide urine sample for preg. Says she cannot go. Pt refusing sample, ok with proceeding with anesthesia. She says there's no way I'm pregnant. Just finished period cycle according to Pt, and pt states husband in 60 day rehab.  Anesthesia ok with proceeding with pt refusal.

## 2024-03-09 LAB — SURGICAL PATHOLOGY

## 2024-03-12 ENCOUNTER — Ambulatory Visit: Payer: Self-pay | Admitting: Physician Assistant

## 2024-03-24 ENCOUNTER — Other Ambulatory Visit: Payer: Self-pay | Admitting: Physician Assistant

## 2024-03-24 DIAGNOSIS — I10 Essential (primary) hypertension: Secondary | ICD-10-CM

## 2024-03-27 ENCOUNTER — Ambulatory Visit: Admitting: Pain Medicine

## 2024-03-29 ENCOUNTER — Telehealth: Payer: Self-pay

## 2024-03-29 NOTE — Patient Instructions (Signed)
 Kaylee Mcguire - I am sorry I was unable to reach you today for our scheduled appointment. I work with Ostwalt, Janna, PA-C and am calling to support your healthcare needs. Please contact me at [phone] at your earliest convenience. I look forward to speaking with you next Tuesday at 10:00 AM   Thank you,  Hendricks Her RN, BSN  [place] [facility]BC[facility]-Population Health RN Case Manager   Direct [phone]

## 2024-04-03 ENCOUNTER — Other Ambulatory Visit: Payer: Self-pay

## 2024-04-03 NOTE — Patient Outreach (Signed)
 Complex Care Management   Visit Note  04/03/2024  Name:  Kaylee Mcguire MRN: [id number] DOB: 29-Nov-1975  Situation: Referral received for Complex Care Management related to Hypertension I obtained verbal consent from Patient.  Visit completed with Patient  on the phone  Background:   Past Medical History:  Diagnosis Date   ADHD    ADHD (attention deficit hyperactivity disorder)    Allergy    Anxiety    Arthritis    Brain dysfunction    Carpal tunnel syndrome    Depression    Eczema    Eczema    IBS (irritable bowel syndrome)    Neuromuscular disorder (HCC)    PTSD (post-traumatic stress disorder)     Assessment: Patient Reported Symptoms:  Cognitive Cognitive Status: Difficulties with attention and concentration Cognitive/Intellectual Conditions Management [RPT]: Behavior Disorders Behavior Disorders: ADHD      Neurological Neurological Review of Symptoms: No symptoms reported Neurological Management Strategies: Routine screening Neurological Self-Management Outcome: 4 (good)  HEENT HEENT Symptoms Reported: Change or loss of hearing HEENT Management Strategies: Coping strategies HEENT Self-Management Outcome: 4 (good) HEENT Comment: Needs hearing aides    Cardiovascular   Weight: 235 lb (106.6 kg)  Respiratory      Endocrine      Gastrointestinal Gastrointestinal Symptoms Reported: Reflux/heartburn Other Gastrointestinal Symptoms: on medications      Genitourinary Genitourinary Symptoms Reported: No symptoms reported    Integumentary      Musculoskeletal          Psychosocial       Quality of Family Relationships: helpful, involved, supportive Do you feel physically threatened by others?: No    04/03/2024    PHQ2-9 Depression Screening   Little interest or pleasure in doing things    Feeling down, depressed, or hopeless    PHQ-2 - Total Score    Trouble falling or staying asleep, or sleeping too much    Feeling tired or having little energy     Poor appetite or overeating     Feeling bad about yourself - or that you are a failure or have let yourself or your family down    Trouble concentrating on things, such as reading the newspaper or watching television    Moving or speaking so slowly that other people could have noticed.  Or the opposite - being so fidgety or restless that you have been moving around a lot more than usual    Thoughts that you would be better off dead, or hurting yourself in some way    PHQ2-9 Total Score    If you checked off any problems, how difficult have these problems made it for you to do your work, take care of things at home, or get along with other people    Depression Interventions/Treatment      Today's Vitals   04/03/24 1007  BP: (!) 140/79  Pulse: 80  Weight: 235 lb (106.6 kg)  Height: 5' 3 (1.6 m)   Pain Scale: 0-10 Pain Score: 7  Pain Type: Chronic pain Pain Location: Back Pain Orientation: Medial, Lower Pain Descriptors / Indicators: Stabbing  Medications Reviewed Today   Medications were not reviewed in this encounter     Recommendation:   Continue Current Plan of Care  Follow Up Plan:   Telephone follow-up in 1 month  Hendricks Her RN, BSN  [place] [facility]BC[facility]-Population Health RN Case Information Systems Manager [phone]

## 2024-04-03 NOTE — Patient Instructions (Signed)
 Visit Information  Ms. Bovey was given information about Medicaid Managed Care team care coordination services as a part of their [employer] Medicaid benefit.   If you would like to schedule transportation through your [employer] [facility] plan, please call the following number at least 2 days in advance of your appointment: [phone]  If you are experiencing a behavioral health crisis, call the [employer]   Behavioral Health Crisis Line at [phone]). The line is available 24 hours a day, seven days a week.    Please see education materials related to hypertension provided by MyChart link.  Care plan and visit instructions communicated with the patient verbally today. Patient agrees to receive a copy in MyChart. Active MyChart status and patient understanding of how to access instructions and care plan via MyChart confirmed with patient.     Telephone follow up appointment with Managed Medicaid care management team member scheduled for:05/03/2024 at 10:00 AM   Hendricks Her RN, BSN  [place] [facility]BC[facility]-Population Health RN Case Manager   Direct [phone]   Following is a copy of your plan of care:   Goals Addressed             This Visit's Progress    VBCI RN Care Plan       Problems:  Chronic Disease Management support and education needs related to HTN  Goal: Over the next 90  days the Patient will attend all scheduled medical appointments: with providers   as evidenced by adherence to scheduled appointments         collaborate with the care management team towards completion of advanced directives after discussing with family as evidenced by advanced directive documents in medical record   demonstrate ongoing self health care management ability to monitor for exacerbations of hypertension  as evidenced by taking and recording daily blood pressure UPDATE 02-27-2024  Patient stated she will be more diligent  about taking daily BP and recording  take all medications exactly as prescribed and will call provider for medication related questions as evidenced by contacting provider with any mediation questions or concerns     verbalize understanding of plan for management of  hypertension as evidenced by verbalizing targeted blood pressure goal of <130/90  Coordinate with BSW to address financial housing SDOH needs UPDATE 02-27-2024  Patient has been working with BSW for resources   Interventions:   Hypertension Interventions: Last practice recorded BP readings:  BP Readings from Last 3 Encounters:  04/03/24 (!) 140/79  03/08/24 (!) 141/88  03/05/24 (!) 140/90   Most recent eGFR/CrCl:  Lab Results  Component Value Date   EGFR 100 11/28/2023    No components found for: CRCL  Evaluation of current treatment plan related to hypertension self management and patient's adherence to plan as established by provider Provided education to patient re: stroke prevention, s/s of heart attack and stroke Reviewed medications with patient and discussed importance of compliance Counseled on the importance of exercise goals with target of 150 minutes per week Discussed plans with patient for ongoing care management follow up and provided patient with direct contact information for care management team Advised patient, providing education and rationale, to monitor blood pressure daily and record, calling PCP for findings outside established parameters Reviewed scheduled/upcoming provider appointments including:  04/11/2024 Grayce Bohr 1:30 PM  04/17/2024 Francisco Naveira 10:20 AM  05/02/2024 Janna Oswalt 1:20 PM  Advised patient to discuss any medication concerns with provider Provided education on prescribed diet DASH  Discussed complications of poorly controlled blood pressure such as heart disease, stroke, circulatory complications, vision complications, kidney impairment, sexual dysfunction Screening for  signs and symptoms of depression related to chronic disease state  Assessed social determinant of health barriers  Patient Self-Care Activities:  Attend all scheduled provider appointments Call pharmacy for medication refills 3-7 days in advance of running out of medications Call provider office for new concerns or questions  Notify RN Care Manager of TOC call rescheduling needs Take medications as prescribed   check blood pressure daily write blood pressure results in a log or diary take blood pressure log to all doctor appointments call doctor for signs and symptoms of high blood pressure keep all doctor appointments take medications for blood pressure exactly as prescribed begin an exercise program eat more whole grains, fruits and vegetables, lean meats and healthy fats limit salt intake to <1500 mg/day Update 04/03/2024 Reduce amount of Diet Soft Drinks per day   Plan:  Telephone follow up appointment with care management team member scheduled for:  05-03-2024 at 10:00 AM

## 2024-04-05 ENCOUNTER — Ambulatory Visit: Admitting: Pain Medicine

## 2024-04-10 NOTE — Progress Notes (Unsigned)
 "   04/10/2024 Kaylee Mcguire [id number] 1975/09/08  Gastroenterology Office Note     Primary Care Physician:  Dineen Channel, PA-C  Primary GI Provider: Celestia Rima, NP; Jinny Carmine, MD    Chief Complaint   No chief complaint on file.    History of Present Illness   Kaylee Mcguire is a 49 y.o. female withPMHX of hypertension, obesity, eczema presenting today   Patient last seen 02/29/2024 with dysphagia, diarrhea, and abdominal pain.   03/08/2024 Colonoscopy and EGD - One 4 mm polyp in the cecum, removed with a cold snare. Resected and retrieved. SESSILE SERRATED ADENOMA.   - Repeat in 5 years.   - Normal esophagus. Dilated.  - Normal stomach, normal examined duodenum.  - Two biopsies were obtained in the middle third of the esophagus. SQUAMOUS MUCOSA WITH MILD REFLUX CHANGES. NEGATIVE FOR HELICOBACTER PYLORI.    Past Medical History:  Diagnosis Date   ADHD    ADHD (attention deficit hyperactivity disorder)    Allergy    Anxiety    Arthritis    Brain dysfunction    Carpal tunnel syndrome    Depression    Eczema    Eczema    IBS (irritable bowel syndrome)    Neuromuscular disorder (HCC)    PTSD (post-traumatic stress disorder)     Past Surgical History:  Procedure Laterality Date   COLONOSCOPY N/A 03/08/2024   Procedure: COLONOSCOPY;  Surgeon: Jinny Carmine, MD;  Location: [facility] ENDOSCOPY;  Service: Endoscopy;  Laterality: N/A;   ESOPHAGOGASTRODUODENOSCOPY N/A 03/08/2024   Procedure: EGD (ESOPHAGOGASTRODUODENOSCOPY);  Surgeon: Jinny Carmine, MD;  Location: [facility] ENDOSCOPY;  Service: Endoscopy;  Laterality: N/A;   POLYPECTOMY  03/08/2024   Procedure: POLYPECTOMY, INTESTINE;  Surgeon: Jinny Carmine, MD;  Location: [facility] ENDOSCOPY;  Service: Endoscopy;;   RECTAL BIOPSY  03/08/2024   Procedure: BIOPSY, GI;  Surgeon: Jinny Carmine, MD;  Location: [facility] ENDOSCOPY;  Service: Endoscopy;;    Current Outpatient Medications  Medication Sig Dispense Refill    acetaminophen  (TYLENOL ) 500 MG tablet Take 500 mg by mouth 3 (three) times daily.     albuterol  (VENTOLIN  HFA) 108 (90 Base) MCG/ACT inhaler Inhale 2 puffs into the lungs every 6 (six) hours as needed for wheezing or shortness of breath. 18 g 2   cetirizine  (ZYRTEC ) 10 MG tablet TAKE 1 TABLET BY MOUTH ONCE DAILY 90 tablet 2   diclofenac  (VOLTAREN ) 75 MG EC tablet Take 1 tablet (75 mg total) by mouth 2 (two) times daily. 60 tablet 5   Dupilumab  (DUPIXENT ) 300 MG/2ML SOAJ Inject 300 mg into the skin every 14 (fourteen) days. Starting at day 15 for maintenance. 4 mL 5   EPINEPHrine 0.3 mg/0.3 mL IJ SOAJ injection as directed Injection prn for systemic reactions for 30 days     fluticasone -salmeterol (WIXELA INHUB) 100-50 MCG/ACT AEPB Inhale 1 puff into the lungs 2 (two) times daily. (Patient taking differently: Inhale 1 puff into the lungs as needed.) 60 each 1   Hypertonic Nasal Wash (SINUS RINSE BOTTLE KIT) PACK Place 1 each into the nose daily.     ibuprofen  (ADVIL ) 600 MG tablet Take 600 mg by mouth 3 (three) times daily.     ipratropium (ATROVENT ) 0.03 % nasal spray Place 2 sprays into both nostrils every 12 (twelve) hours for 3 days. 30 mL 0   lisinopril  (ZESTRIL ) 5 MG tablet TAKE 1 TABLET BY MOUTH ONCE DAILY 90 tablet 3   lumateperone  tosylate (CAPLYTA ) 42 MG capsule Take 1 capsule (42  mg total) by mouth daily. 30 capsule 2   pantoprazole  (PROTONIX ) 40 MG tablet Take 1 tablet (40 mg total) by mouth daily. 90 tablet 0   tacrolimus  (PROTOPIC ) 0.1 % ointment Apply topically 2 (two) times daily. 100 g 0   triamcinolone  cream (KENALOG ) 0.1 % Apply 1 Application topically 2 (two) times daily. (Patient taking differently: Apply 1 Application topically as needed.) 463 g 0   No current facility-administered medications for this visit.    Allergies as of 04/11/2024 - Review Complete 04/03/2024  Allergen Reaction Noted   Bee venom Anaphylaxis 11/02/2023   Gabapentin Hives 01/21/2020   Codeine   04/18/2013   Peanut-containing drug products  11/02/2023   Sulfa antibiotics  12/05/2023    Family History  Problem Relation Age of Onset   Heart disease Mother    Aneurysm Mother    Diabetes Mother    Hypotension Mother    Allergies Mother    Food Allergy Mother    Cataracts Mother    COPD Mother    Heart disease Father    Diabetes Father    COPD Father    Hypertension Father    Diabetes Brother    Aneurysm Maternal Grandmother    Heart failure Maternal Grandmother    Cancer Maternal Grandfather    Heart attack Paternal Grandmother    Asthma Daughter    Kidney failure Daughter    Irritable bowel syndrome Daughter    Childhood respiratory disease Daughter    Bladder Cancer Neg Hx    Kidney cancer Neg Hx     Social History   Socioeconomic History   Marital status: Significant Other    Spouse name: Not on file   Number of children: 2   Years of education: Not on file   Highest education level: Some college, no degree  Occupational History   Not on file  Tobacco Use   Smoking status: Every Day    Current packs/day: 0.50    Types: Cigarettes   Smokeless tobacco: Never  Vaping Use   Vaping status: Never Used  Substance and Sexual Activity   Alcohol use: Not Currently   Drug use: No   Sexual activity: Yes    Birth control/protection: None  Other Topics Concern   Not on file  Social History Narrative   Not on file   Social Drivers of Health   Tobacco Use: High Risk (03/08/2024)   Patient History    Smoking Tobacco Use: Every Day    Smokeless Tobacco Use: Never    Passive Exposure: Not on file  Financial Resource Strain: Medium Risk (11/09/2023)   Received from [facility]   Overall Financial Resource Strain (CARDIA)    Difficulty of Paying Living Expenses: Somewhat hard  Food Insecurity: No Food Insecurity (02/27/2024)   Epic    Worried About Programme Researcher, Broadcasting/film/video in the Last Year: Never true    Ran Out of Food in the Last Year: Never  true  Recent Concern: Food Insecurity - Food Insecurity Present (02/02/2024)   Epic    Worried About Programme Researcher, Broadcasting/film/video in the Last Year: Often true    Ran Out of Food in the Last Year: Often true  Transportation Needs: No Transportation Needs (02/27/2024)   Epic    Lack of Transportation (Medical): No    Lack of Transportation (Non-Medical): No  Physical Activity: Sufficiently Active (11/09/2023)   Received from [facility]   Exercise Vital Sign  On average, how many days per week do you engage in moderate to strenuous exercise (like a brisk walk)?: 7 days    On average, how many minutes do you engage in exercise at this level?: 150+ min  Stress: Stress Concern Present (11/09/2023)   Received from [facility] - Occupational Stress Questionnaire    Feeling of Stress : To some extent  Social Connections: Moderately Integrated (11/09/2023)   Received from [facility]   Social Connection and Isolation Panel    In a typical week, how many times do you talk on the phone with family, friends, or neighbors?: More than three times a week    How often do you get together with friends or relatives?: More than three times a week    How often do you attend church or religious services?: More than 4 times per year    Do you belong to any clubs or organizations such as church groups, unions, fraternal or athletic groups, or school groups?: No    How often do you attend meetings of the clubs or organizations you belong to?: Never    Are you married, widowed, divorced, separated, never married, or living with a partner?: Living with partner  Intimate Partner Violence: Not At Risk (02/27/2024)   Epic    Fear of Current or Ex-Partner: No    Emotionally Abused: No    Physically Abused: No    Sexually Abused: No  Depression (PHQ2-9): Low Risk (03/05/2024)   Depression (PHQ2-9)    PHQ-2 Score: 0  Alcohol  Screen: Low Risk (04/07/2023)   Alcohol Screen    Last Alcohol Screening Score (AUDIT): 0  Housing: High Risk (02/27/2024)   Epic    Unable to Pay for Housing in the Last Year: No    Number of Times Moved in the Last Year: 1    Homeless in the Last Year: Yes  Utilities: Not At Risk (02/27/2024)   Epic    Threatened with loss of utilities: No  Health Literacy: Inadequate Health Literacy (11/09/2023)   Received from [facility]   B1300 Health Literacy    How often do you need to have someone help you when you read instructions, pamphlets, or other written material from your doctor or pharmacy?: Sometimes     RELEVANT GI HISTORY, IMAGING AND LABS: CBC    Component Value Date/Time   WBC 8.9 07/20/2023 1118   WBC 13.8 (H) 04/13/2018 2253   RBC 4.41 07/20/2023 1118   RBC 4.50 04/13/2018 2253   HGB 13.4 07/20/2023 1118   HCT 40.5 07/20/2023 1118   PLT 325 07/20/2023 1118   MCV 92 07/20/2023 1118   MCV 91 06/02/2012 2324   MCH 30.4 07/20/2023 1118   MCH 29.8 04/13/2018 2253   MCHC 33.1 07/20/2023 1118   MCHC 32.4 04/13/2018 2253   RDW 12.7 07/20/2023 1118   RDW 13.8 06/02/2012 2324   LYMPHSABS 1.7 07/20/2023 1118   EOSABS 0.6 (H) 07/20/2023 1118   BASOSABS 0.0 07/20/2023 1118   Recent Labs    07/20/23 1118  HGB 13.4    CMP     Component Value Date/Time   NA 141 11/28/2023 1339   NA 139 06/02/2012 2324   K 4.6 11/28/2023 1339   K 3.9 06/02/2012 2324   CL 107 (H) 11/28/2023 1339   CL 108 (H) 06/02/2012 2324   CO2 19 (L) 11/28/2023 1339   CO2  23 06/02/2012 2324   GLUCOSE 82 11/28/2023 1339   GLUCOSE 102 (H) 04/13/2018 2253   GLUCOSE 91 06/02/2012 2324   BUN 18 11/28/2023 1339   BUN 23 (H) 06/02/2012 2324   CREATININE 0.74 11/28/2023 1339   CREATININE 1.04 06/02/2012 2324   CALCIUM 9.5 11/28/2023 1339   CALCIUM 9.1 06/02/2012 2324   PROT 6.8 11/28/2023 1339   PROT 7.6 06/02/2012 2324   ALBUMIN 4.1 11/28/2023 1339   ALBUMIN 3.7 06/02/2012 2324    AST 14 11/28/2023 1339   AST 14 (L) 06/02/2012 2324   ALT 17 11/28/2023 1339   ALT 19 06/02/2012 2324   ALKPHOS 62 11/28/2023 1339   ALKPHOS 77 06/02/2012 2324   BILITOT <0.2 11/28/2023 1339   BILITOT 0.1 (L) 06/02/2012 2324   GFRNONAA >60 04/13/2018 2253   GFRNONAA >60 06/02/2012 2324   GFRAA >60 04/13/2018 2253   GFRAA >60 06/02/2012 2324      Latest Ref Rng & Units 11/28/2023    1:39 PM 07/20/2023   11:18 AM 01/27/2023    3:43 PM  Hepatic Function  Total Protein 6.0 - 8.5 g/dL 6.8  6.2  4.9   Albumin 3.9 - 4.9 g/dL 4.1  4.1  3.4   AST 0 - 40 IU/L 14  16  20    ALT 0 - 32 IU/L 17  16  21    Alk Phosphatase 44 - 121 IU/L 62  58  62   Total Bilirubin 0.0 - 1.2 mg/dL <9.7  0.3  <9.7       Review of Systems   All systems reviewed and negative except where noted in HPI.    Physical Exam  There were no vitals taken for this visit. No LMP recorded. General:   Alert and oriented. Pleasant and cooperative. Well-nourished and well-developed. In no acute distress.  Head:  Normocephalic and atraumatic. Eyes:  Without icterus Ears:  Normal auditory acuity. Neck:  Supple; no masses or thyromegaly. Lungs:  Respirations even and unlabored.  Clear throughout to auscultation.   No wheezes, crackles, or rhonchi. No acute distress. Heart:  Regular rate and rhythm; no murmurs, clicks, rubs, or gallops. Abdomen:  Normal bowel sounds.  No bruits.  Soft, non-tender and non-distended without masses, hepatosplenomegaly or hernias noted.  No guarding or rebound tenderness.  ***Negative Carnett sign.   Rectal:  Deferred.***  Msk:  Symmetrical without gross deformities. Normal posture. Extremities:  Without edema. Neurologic:  Alert and  oriented x4;  grossly normal neurologically. Skin:  Intact without significant lesions or rashes. Psych:  Alert and cooperative. Normal mood and affect.   Assessment & Plan   Kaylee Mcguire is a 49 y.o. female presenting today with     Grayce Bohr, DNP,  AGNP-C [facility] [place] [facility]   "

## 2024-04-11 ENCOUNTER — Ambulatory Visit: Admitting: Family Medicine

## 2024-04-16 NOTE — Patient Instructions (Signed)
 ______________________________________________________________________    Post-Procedure Discharge Instructions  INSTRUCTIONS Apply ice:  Purpose: This will minimize any swelling and discomfort after procedure.  When: Day of procedure, as soon as you get home. How: Fill a plastic sandwich bag with crushed ice. Cover it with a small towel and apply to injection site. How long: (15 min on, 15 min off) Apply for 15 minutes then remove x 15 minutes.  Repeat sequence on day of procedure, until you go to bed. Apply heat:  Purpose: To treat any soreness and discomfort from the procedure. When: Starting the next day after the procedure. How: Apply heat to procedure site starting the day following the procedure. How long: May continue to repeat daily, until discomfort goes away. Food intake: Start with clear liquids (like water) and advance to regular food, as tolerated.  Physical activities: Keep activities to a minimum for the first 8 hours after the procedure. After that, then as tolerated. Driving: If you have received any sedation, be responsible and do not drive. You are not allowed to drive for 24 hours after having sedation. Blood thinner: (Applies only to those taking blood thinners) You may restart your blood thinner 6 hours after your procedure. Insulin: (Applies only to Diabetic patients taking insulin) As soon as you can eat, you may resume your normal dosing schedule. Infection prevention: Keep procedure site clean and dry. Shower daily and clean area with soap and water.  PAIN DIARY Post-procedure Pain Diary: Extremely important that this be done correctly and accurately. Recorded information will be used to determine the next step in treatment. For the purpose of accuracy, follow these rules: Evaluate only the area treated. Do not report or include pain from an untreated area. For the purpose of this evaluation, ignore all other areas of pain, except for the treated area. After your  procedure, avoid taking a long nap and attempting to complete the pain diary after you wake up. Instead, set your alarm clock to go off every hour, on the hour, for the initial 8 hours after the procedure. Document the duration of the numbing medicine, and the relief you are getting from it. Do not go to sleep and attempt to complete it later. It will not be accurate. If you received sedation, it is likely that you were given a medication that may cause amnesia. Because of this, completing the diary at a later time may cause the information to be inaccurate. This information is needed to plan your care. Follow-up appointment: Keep your post-procedure follow-up evaluation appointment after the procedure (usually 2 weeks for most procedures, 6 weeks for radiofrequencies). DO NOT FORGET to bring you pain diary with you.   EXPECT... (What should I expect to see with my procedure?) From numbing medicine (AKA: Local Anesthetics): Numbness or decrease in pain. You may also experience some weakness, which if present, could last for the duration of the local anesthetic. Onset: Full effect within 15 minutes of injected. Duration: It will depend on the type of local anesthetic used. On the average, 1 to 8 hours.  From steroids (Applies only if steroids were used): Decrease in swelling or inflammation. Once inflammation is improved, relief of the pain will follow. Onset of benefits: Depends on the amount of swelling present. The more swelling, the longer it will take for the benefits to be seen. In some cases, up to 10 days. Duration: Steroids will stay in the system x 2 weeks. Duration of benefits will depend on multiple posibilities including persistent irritating  factors. Side-effects: If present, they may typically last 2 weeks (the duration of the steroids). Frequent: Cramps (if they occur, drink Gatorade and take over-the-counter Magnesium 450-500 mg once to twice a day); water retention with temporary weight  gain; increases in blood sugar; decreased immune system response; increased appetite. Occasional: Facial flushing (red, warm cheeks); mood swings; menstrual changes. Uncommon: Long-term decrease or suppression of natural hormones; bone thinning. (These are more common with higher doses or more frequent use. This is why we prefer that our patients avoid having any injection therapies in other practices.)  Very Rare: Severe mood changes; psychosis; aseptic necrosis. From procedure: Some discomfort is to be expected once the numbing medicine wears off. This should be minimal if ice and heat are applied as instructed.  CALL IF... (When should I call?) You experience numbness and weakness that gets worse with time, as opposed to wearing off. New onset bowel or bladder incontinence. (Applies only to procedures done in the spine)  Emergency Numbers: Durning business hours (Monday - Thursday, 8:00 AM - 4:00 PM) (Friday, 9:00 AM - 12:00 Noon): [phone] After hours: [phone] NOTE: If you are having a problem and are unable connect with, or to talk to a provider, then go to your nearest urgent care or emergency department. If the problem is serious and urgent, please call 911. ______________________________________________________________________     ______________________________________________________________________    Steroid injections  Common steroids for injections Triamcinolone : Used by many sports medicine physicians for large joint and bursal injections, often combined with a local anesthetic like lidocaine . A study focusing on coccydynia (tailbone pain) found triamcinolone  was more effective than betamethasone, suggesting it may also be preferable for other localized inflammation conditions. Methylprednisolone: A common alternative to triamcinolone  that is also a strong anti-inflammatory. It is available in different formulations, with the acetate suspension being the long-acting  option for intra-articular injections. Dexamethasone : This is a non-particulate steroid, meaning it has a lower risk of tissue damage compared to particulate steroids like triamcinolone  and methylprednisolone. While less common for this specific use, it is an option for targeted injections.   Considerations for physicians Particulate vs. non-particulate steroids: Triamcinolone  and methylprednisolone are particulate, meaning they can clump together. Dexamethasone  is non-particulate. Particulate steroids are often preferred for their longer-lasting effects but carry a theoretical higher risk for certain injections (though this is less of a concern in the costochondral joints). Combined injectate: Corticosteroids are typically mixed with a local anesthetic like lidocaine  to provide both immediate pain relief (from the anesthetic) and longer-term inflammation reduction (from the steroid). Imaging guidance: To ensure accurate placement of the needle and medication, physicians may use ultrasound or fluoroscopic guidance for the injection, especially in complex or refractory cases.   Patient guidance Before undergoing a steroid injection, discuss the options with your physician. They will determine the best steroid, dosage, and procedure for your specific case based on factors like: Severity of your condition History of response to other treatments Your overall health status Experience and preference of the physician  Last  Updated: 11/15/2023 ______________________________________________________________________

## 2024-04-16 NOTE — Progress Notes (Unsigned)
 PROVIDER NOTE: Interpretation of information contained herein should be left to medically-trained personnel. Specific patient instructions are provided elsewhere under Patient Instructions section of medical record. This document was created in part using STT-dictation technology, any transcriptional errors that may result from this process are unintentional.  Patient: Kaylee Mcguire Type: Established DOB: Aug 13, 1975 MRN: [id number] PCP: Ostwalt, Janna, PA-C  Service: Procedure DOS: 04/17/2024 Setting: Ambulatory Location: Ambulatory outpatient facility Delivery: Face-to-face Provider: Eric DELENA Como, MD Specialty: Interventional Pain Management Specialty designation: 09 Location: Outpatient facility Ref. Prov.: Como Eric, MD       Interventional Therapy   Type: Lumbar Facet, Medial Branch Block(s)   #2 + (Left) IA L3-4 & L4-5 Facet inj. #1  Laterality: Bilateral  Level: L2, L3, L4, L5, and S1 Medial Branch/Dorsal Rami Level(s). Injecting these levels blocks the L3-4, L4-5, and L5-S1 lumbar facet joints. Imaging: Fluoroscopic guidance Spinal ([id number]) Anesthesia: Local anesthesia (1-2% Lidocaine ) Anxiolysis: None Unable to secure a driver.                    Analgesia: No Sedation Unable to secure a driver.         DOS: 04/17/2024 Performed by: Eric DELENA Como, MD  Primary Purpose: Diagnostic/Therapeutic Indications: Low back pain severe enough to impact quality of life or function. 1. Chronic low back pain (Bilateral) w/o sciatica   2. Low back pain of over 3 months duration   3. Lumbar facet joint pain   4. Lumbar facet arthropathy (Multilevel) (Bilateral)   5. Lumbar facet joint syndrome   6. Spondylosis without myelopathy or radiculopathy, lumbar region   7. 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 Abnormal MRI, lumbar spine (02/05/2020) ([facility])   9. Encounter for therapeutic procedure    NAS-11 Pain score:   Pre-procedure: 8 /10    Post-procedure: 8 /10     Position / Prep / Materials:  Position: Prone  Prep solution: ChloraPrep (2% chlorhexidine gluconate and 70% isopropyl alcohol) Area Prepped: Posterolateral Lumbosacral Spine (Wide prep: From the lower border of the scapula down to the end of the tailbone and from flank to flank.)  Materials:  Tray: Block Needle(s):  Type: Spinal  Gauge (G): 22  Length: 5-in Qty: 4     H&P (Pre-op Assessment):  Kaylee Mcguire is a 49 y.o. (year old), female patient, seen today for interventional treatment. She  has a past surgical history that includes Colonoscopy (N/A, 03/08/2024); Esophagogastroduodenoscopy (N/A, 03/08/2024); Polypectomy (03/08/2024); and Rectal biopsy (03/08/2024). Kaylee Mcguire has a current medication list which includes the following prescription(s): acetaminophen , albuterol , cetirizine , diclofenac , dupixent , epinephrine, fluticasone -salmeterol, sinus rinse bottle kit, ibuprofen , ipratropium, lisinopril , lumateperone  tosylate, pantoprazole , tacrolimus , triamcinolone  cream, [DISCONTINUED] sertraline, and [DISCONTINUED] trazodone. Her primarily concern today is the Back Pain (lower)  Initial Vital Signs:  Pulse/HCG Rate: 71ECG Heart Rate: 82 Temp: (!) 97.3 F (36.3 C) Resp: 12 BP: (!) 164/91 (pt states she did not take her BP meds this am; Dr Como notified and pt had BP meds with her and took it) SpO2: 99 %  BMI: Estimated body mass index is 41.63 kg/m as calculated from the following:   Height as of this encounter: 5' 3 (1.6 m).   Weight as of this encounter: 235 lb (106.6 kg).  Risk Assessment: Allergies: Reviewed. She is allergic to bee venom, gabapentin, codeine, peanut-containing drug products, and sulfa antibiotics.  Allergy Precautions: None required Coagulopathies: Reviewed. None identified.  Blood-thinner therapy: None at this time Active Infection(s):  Reviewed. None identified. Kaylee Mcguire is afebrile  Site Confirmation: Kaylee Mcguire was asked  to confirm the procedure and laterality before marking the site Procedure checklist: Completed Consent: Before the procedure and under the influence of no sedative(s), amnesic(s), or anxiolytics, the patient was informed of the treatment options, risks and possible complications. To fulfill our ethical and legal obligations, as recommended by the American Medical Association's Code of Ethics, I have informed the patient of my clinical impression; the nature and purpose of the treatment or procedure; the risks, benefits, and possible complications of the intervention; the alternatives, including doing nothing; the risk(s) and benefit(s) of the alternative treatment(s) or procedure(s); and the risk(s) and benefit(s) of doing nothing. The patient was provided information about the general risks and possible complications associated with the procedure. These may include, but are not limited to: failure to achieve desired goals, infection, bleeding, organ or nerve damage, allergic reactions, paralysis, and death. In addition, the patient was informed of those risks and complications associated to Spine-related procedures, such as failure to decrease pain; infection (i.e.: Meningitis, epidural or intraspinal abscess); bleeding (i.e.: epidural hematoma, subarachnoid hemorrhage, or any other type of intraspinal or peri-dural bleeding); organ or nerve damage (i.e.: Any type of peripheral nerve, nerve root, or spinal cord injury) with subsequent damage to sensory, motor, and/or autonomic systems, resulting in permanent pain, numbness, and/or weakness of one or several areas of the body; allergic reactions; (i.e.: anaphylactic reaction); and/or death. Furthermore, the patient was informed of those risks and complications associated with the medications. These include, but are not limited to: allergic reactions (i.e.: anaphylactic or anaphylactoid reaction(s)); adrenal axis suppression; blood sugar elevation that in  diabetics may result in ketoacidosis or comma; water retention that in patients with history of congestive heart failure may result in shortness of breath, pulmonary edema, and decompensation with resultant heart failure; weight gain; swelling or edema; medication-induced neural toxicity; particulate matter embolism and blood vessel occlusion with resultant organ, and/or nervous system infarction; and/or aseptic necrosis of one or more joints. Finally, the patient was informed that Medicine is not an exact science; therefore, there is also the possibility of unforeseen or unpredictable risks and/or possible complications that may result in a catastrophic outcome. The patient indicated having understood very clearly. We have given the patient no guarantees and we have made no promises. Enough time was given to the patient to ask questions, all of which were answered to the patient's satisfaction. Ms. Bundick has indicated that she wanted to continue with the procedure. Attestation: I, the ordering provider, attest that I have discussed with the patient the benefits, risks, side-effects, alternatives, likelihood of achieving goals, and potential problems during recovery for the procedure that I have provided informed consent. Date  Time: 04/17/2024 10:09 AM  Pre-Procedure Preparation:  Monitoring: As per clinic protocol. Respiration, ETCO2, SpO2, BP, heart rate and rhythm monitor placed and checked for adequate function Safety Precautions: Patient was assessed for positional comfort and pressure points before starting the procedure. Time-out: I initiated and conducted the Time-out before starting the procedure, as per protocol. The patient was asked to participate by confirming the accuracy of the Time Out information. Verification of the correct person, site, and procedure were performed and confirmed by me, the nursing staff, and the patient. Time-out conducted as per Joint Commission's Universal  Protocol (UP.01.01.01). Time: 1128 Start Time: 1128 hrs.  Description of Procedure:          Laterality: (see above) Targeted Levels: (see above)  Safety Precautions: Aspiration looking for blood return was conducted prior to all injections. At no point did we inject any substances, as a needle was being advanced. Before injecting, the patient was told to immediately notify me if she was experiencing any new onset of ringing in the ears, or metallic taste in the mouth. No attempts were made at seeking any paresthesias. Safe injection practices and needle disposal techniques used. Medications properly checked for expiration dates. SDV (single dose vial) medications used. After the completion of the procedure, all disposable equipment used was discarded in the proper designated medical waste containers. Local Anesthesia: Protocol guidelines were followed. The patient was positioned over the fluoroscopy table. The area was prepped in the usual manner. The time-out was completed. The target area was identified using fluoroscopy. A 12-in long, straight, sterile hemostat was used with fluoroscopic guidance to locate the targets for each level blocked. Once located, the skin was marked with an approved surgical skin marker. Once all sites were marked, the skin (epidermis, dermis, and hypodermis), as well as deeper tissues (fat, connective tissue and muscle) were infiltrated with a small amount of a short-acting local anesthetic, loaded on a 10cc syringe with a 25G, 1.5-in  Needle. An appropriate amount of time was allowed for local anesthetics to take effect before proceeding to the next step. Local Anesthetic: Lidocaine  2.0% The unused portion of the local anesthetic was discarded in the proper designated containers. Technical description of process:  Medial Branch  Dorsal Rami Nerve Block (MBB):  Neuroanatomy note: Each lumbar facet joint receives dual innervation from medial branches arising from the  posterior primary rami at the same level and one level above. The target for each lumbar medial branch is the junction of the ipsilateral superior articular and transverse process of the lower vertebral body. (i.e.: The L4-L5 facet joint is innervated by the L4 medial branch [located at L5] and the L3 medial branch [located at L4]. Blocking the L4 Medial Branch is therefore achieved by injecting at the junction of the ipsilateral superior articular and transverse process of the lower vertebral body [L5].).  Exception: The exception to the above rule is the L5-S1 facet joint which has triple innervation requiring the L4 medial branch, as well as the L5 and the S1 Dorsal Rami(s) to be blocked to fully denervate the joint. Note: In the case of the left L3-4 and L4-5, the injections were done intra-articular rather than at the level of the medial branch.  Under fluoroscopic guidance, a needle was inserted until contact was made with os over the target area. After negative aspiration, 0.5 mL of the nerve block solution was injected without difficulty or complication. Paresthesia were avoided during injection. The needle(s) were removed intact and without complication.  Once the entire procedure was completed, the treated area was cleaned, making sure to leave some of the prepping solution back to take advantage of its long term bactericidal properties.         Illustration of the posterior view of the lumbar spine and the posterior neural structures. Laminae of L2 through S1 are labeled. DPRL5, dorsal primary ramus of L5; DPRS1, dorsal primary ramus of S1; DPR3, dorsal primary ramus of L3; FJ, facet (zygapophyseal) joint L3-L4; I, inferior articular process of L4; LB1, lateral branch of dorsal primary ramus of L1; IAB, inferior articular branches from L3 medial branch (supplies L4-L5 facet joint); IBP, intermediate branch plexus; MB3, medial branch of dorsal primary ramus of L3; NR3, third lumbar nerve root;  S, superior articular process of L5; SAB, superior articular branches from L4 (supplies L4-5 facet joint also); TP3, transverse process of L3.   Facet Joint Innervation (* possible contribution)  L1-2 T12, L1 (L2*)  Medial Branch  L2-3 L1, L2 (L3*)                     L3-4 L2, L3 (L4*)                     L4-5 L3, L4 (L5*)                     L5-S1 L4, L5, S1                        Vitals:   04/17/24 1010 04/17/24 1125 04/17/24 1135 04/17/24 1145  BP: (!) 164/91 (!) 182/98 (!) 158/110 (!) 148/106  Pulse: 71     Resp:  12 15 16   Temp: (!) 97.3 F (36.3 C)     SpO2: 99% 100% 100% 100%  Weight:      Height:         End Time: 1142 hrs.  Imaging Guidance (Spinal):         Type of Imaging Technique: Fluoroscopy Guidance (Spinal) Indication(s): Fluoroscopy guidance for needle placement to enhance accuracy in procedures requiring precise needle localization for targeted delivery of medication in or near specific anatomical locations not easily accessible without such real-time imaging assistance. Exposure Time: Please see nurses notes. Contrast: None used. Fluoroscopic Guidance: I was personally present during the use of fluoroscopy. Tunnel Vision Technique used to obtain the best possible view of the target area. Parallax error corrected before commencing the procedure. Direction-depth-direction technique used to introduce the needle under continuous pulsed fluoroscopy. Once target was reached, antero-posterior, oblique, and lateral fluoroscopic projection used confirm needle placement in all planes. Images permanently stored in EMR. Interpretation: No contrast injected. I personally interpreted the imaging intraoperatively. Adequate needle placement confirmed in multiple planes. Permanent images saved into the patient's record.  Post-operative Assessment:  Post-procedure Vital Signs:  Pulse/HCG Rate: 7170 Temp: (!) 97.3 F (36.3 C) Resp: 16 BP: (!) 148/106 SpO2: 100  %  EBL: None  Complications: No immediate post-treatment complications observed by team, or reported by patient.  Note: The patient tolerated the entire procedure well. A repeat set of vitals were taken after the procedure and the patient was kept under observation following institutional policy, for this type of procedure. Post-procedural neurological assessment was performed, showing return to baseline, prior to discharge. The patient was provided with post-procedure discharge instructions, including a section on how to identify potential problems. Should any problems arise concerning this procedure, the patient was given instructions to immediately contact us , at any time, without hesitation. In any case, we plan to contact the patient by telephone for a follow-up status report regarding this interventional procedure.  Comments:  No additional relevant information.  Plan of Care (POC)  Orders:  Orders Placed This Encounter  Procedures   LUMBAR FACET(MEDIAL BRANCH NERVE BLOCK) MBNB    Scheduling Instructions:     Procedure: Lumbar facet block (AKA.: Lumbosacral medial branch nerve block)     Side: Bilateral     Level: L3-4, L4-5, and L5-S1 Facets (L2, L3, L4, L5, and S1 Medial Branch Nerves)     Procedural Analgesia/Anxiolysis: Declined     Date: 04/17/2024    Where will this procedure be performed?:  [facility]   DG PA[facility] C-ARM 1-60 MIN NO REPORT    Intraoperative interpretation by procedural physician at [facility].    Standing Status:   Standing    Number of Occurrences:   1    Reason for exam::   Assistance in needle guidance and placement for procedures requiring needle placement in or near specific anatomical locations not easily accessible without such assistance.   Informed Consent Details: Physician/Practitioner Attestation; Transcribe to consent form and obtain patient signature    Nursing Order: Transcribe to consent form and obtain patient  signature. Note: Always confirm laterality of pain with Ms. Hebert, before procedure.    Physician/Practitioner attestation of informed consent for procedure/surgical case:   I, the physician/practitioner, attest that I have discussed with the patient the benefits, risks, side effects, alternatives, likelihood of achieving goals and potential problems during recovery for the procedure that I have provided informed consent.    Procedure:   Lumbar Facet Block  under fluoroscopic guidance    Physician/Practitioner performing the procedure:   Starlin Steib A. Tanya MD    Indication/Reason:   Low Back Pain, with our without leg pain, due to Facet Joint Arthralgia (Joint Pain) Spondylosis (Arthritis of the Spine), without myelopathy or radiculopathy (Nerve Damage).   Provide equipment / supplies at bedside    Procedure tray: Block Tray (Disposable  single use) Skin infiltration needle: Regular 1.5-in, 25-G, (x1) Block Needle type: Spinal Amount/quantity: 4 Size: Medium (5-inch) Gauge: 22G    Standing Status:   Standing    Number of Occurrences:   1    Specify:   Block Tray   Follow-up    Post-procedure Phone Call: Call patient tomorrow for routine early follow-up evaluation.  Return Appointment Timeframe: Approximately 2 weeks for post-op eval by Dr. Marcelino or Emmy Blanch, NP.    Standing Status:   Standing    Number of Occurrences:   1    Specify:   Schedule a return appointment for post-procedure evaluation. In addition arrange for patient to receive a follow-up phone call tomorrow to assess post-procedure status.     Opioid Analgesic: none  MME/day: 00 mg/day    Medications ordered for procedure: Meds ordered this encounter  Medications   lidocaine  (XYLOCAINE ) 2 % (with pres) injection 400 mg   pentafluoroprop-tetrafluoroeth (GEBAUERS) aerosol 1 Application   ropivacaine  (PF) 2 mg/mL (0.2%) (NAROPIN ) injection 18 mL   triamcinolone  acetonide (KENALOG -40) injection 80 mg    Medications administered: We administered lidocaine , pentafluoroprop-tetrafluoroeth, ropivacaine  (PF) 2 mg/mL (0.2%), and triamcinolone  acetonide.  See the medical record for exact dosing, route, and time of administration.    Interventional Therapies  Risk Factors  Considerations  Medical Comorbidities:  ALLERGY: Gabapentin, Codeine, Peanuts, Sulfa  ([place]) (BMI>40)  HTN  IBS  Tobacco abuse  PTSD        Planned  Pending:   Diagnostic bilateral lumbar facet MBB #2 + Left IA L3-4 & L4-5 inj. of synovial cysts #1  Referral to physical therapy ordered (03/05/2024)    Under consideration:   Diagnostic bilateral lumbar facet MBB #2 with possible RFA follow-up   Completed: (Analgesic benefit)1  Diagnostic bilateral lumbar facet MBB x1 by Tanya MD (02/07/2024)    Therapeutic  Palliative (PRN) options:   None established   Completed by other providers:   Therapeutic left US -guided carpal tunnel inj. x1 (11/02/2023) (Hydro-dissection) by Prentice Reges, DO [facility])  Diagnostic/therapeutic right L5/S1 TFESI x1 (03/06/2020) by Ozell Norleen Matte, MD [facility]  PMR)   1(Analgesic benefit): Expressed in percentage (%). (Local anesthetic[LA] +/- sedation  L.A.Local Anesthetic  Steroid benefit  Ongoing benefit)    Follow-up plan:   Return in about 2 weeks (around 05/01/2024) for (Face2F), (PPE), w/ Dr. Marcelino.     Recent Visits Date Type Provider Dept  03/05/24 Office Visit Tanya Glisson, MD [facility]  02/07/24 Procedure visit Tanya Glisson, MD [facility]  01/24/24 Office Visit Patel, Seema K, NP [facility]  Showing recent visits within past 90 days and meeting all other requirements Today's Visits Date Type Provider Dept  04/17/24 Procedure visit Tanya Glisson, MD [facility]  Showing today's visits and meeting all other requirements Future Appointments Date Type Provider Dept  05/01/24 Appointment Marcelino Nurse,  MD [facility]  06/12/24 Appointment Patel, Seema K, NP [facility]  Showing future appointments within next 90 days and meeting all other requirements   Disposition: Discharge home  Discharge (Date  Time): 04/17/2024;   hrs.   Primary Care Physician: Ostwalt, Janna, PA-C Location: [facility] Note by: Glisson DELENA Tanya, MD (TTS technology used. I apologize for any typographical errors that were not detected and corrected.) Date: 04/17/2024; Time: 11:50 AM  Disclaimer:  Medicine is not an visual merchandiser. The only guarantee in medicine is that nothing is guaranteed. It is important to note that the decision to proceed with this intervention was based on the information collected from the patient. The Data and conclusions were drawn from the patient's questionnaire, the interview, and the physical examination. Because the information was provided in large part by the patient, it cannot be guaranteed that it has not been purposely or unconsciously manipulated. Every effort has been made to obtain as much relevant data as possible for this evaluation. It is important to note that the conclusions that lead to this procedure are derived in large part from the available data. Always take into account that the treatment will also be dependent on availability of resources and existing treatment guidelines, considered by other Pain Management Practitioners as being common knowledge and practice, at the time of the intervention. For Medico-Legal purposes, it is also important to point out that variation in procedural techniques and pharmacological choices are the acceptable norm. The indications, contraindications, technique, and results of the above procedure should only be interpreted and judged by a Board-Certified Interventional Pain Specialist with extensive familiarity and expertise in the same exact procedure and technique.

## 2024-04-17 ENCOUNTER — Ambulatory Visit: Admitting: Pain Medicine

## 2024-04-17 ENCOUNTER — Encounter: Payer: Self-pay | Admitting: Pain Medicine

## 2024-04-17 ENCOUNTER — Ambulatory Visit
Admission: RE | Admit: 2024-04-17 | Discharge: 2024-04-17 | Disposition: A | Discharge status: Home / Self Care | Source: Ambulatory Visit | Attending: Pain Medicine | Admitting: Pain Medicine

## 2024-04-17 VITALS — BP 148/106 | HR 71 | Temp 97.3°F | Resp 16 | Ht 63.0 in | Wt 235.0 lb

## 2024-04-17 DIAGNOSIS — M545 Low back pain, unspecified: Secondary | ICD-10-CM | POA: Insufficient documentation

## 2024-04-17 DIAGNOSIS — M79604 Pain in right leg: Secondary | ICD-10-CM | POA: Insufficient documentation

## 2024-04-17 DIAGNOSIS — G8929 Other chronic pain: Secondary | ICD-10-CM | POA: Insufficient documentation

## 2024-04-17 DIAGNOSIS — Z5189 Encounter for other specified aftercare: Secondary | ICD-10-CM | POA: Insufficient documentation

## 2024-04-17 DIAGNOSIS — R937 Abnormal findings on diagnostic imaging of other parts of musculoskeletal system: Secondary | ICD-10-CM | POA: Insufficient documentation

## 2024-04-17 DIAGNOSIS — M47816 Spondylosis without myelopathy or radiculopathy, lumbar region: Secondary | ICD-10-CM

## 2024-04-17 DIAGNOSIS — M5459 Other low back pain: Secondary | ICD-10-CM

## 2024-04-17 DIAGNOSIS — M7138 Other bursal cyst, other site: Secondary | ICD-10-CM | POA: Insufficient documentation

## 2024-04-17 MED ORDER — LIDOCAINE HCL 2 % IJ SOLN
INTRAMUSCULAR | Status: AC
  Filled 2024-04-17: qty 20

## 2024-04-17 MED ORDER — TRIAMCINOLONE ACETONIDE 40 MG/ML IJ SUSP
80.0000 mg | Freq: Once | INTRAMUSCULAR | Status: AC
  Administered 2024-04-17: 80 mg
  Filled 2024-04-17: qty 2

## 2024-04-17 MED ORDER — ROPIVACAINE HCL 2 MG/ML IJ SOLN
18.0000 mL | Freq: Once | INTRAMUSCULAR | Status: AC
  Administered 2024-04-17: 18 mL via PERINEURAL
  Filled 2024-04-17: qty 20

## 2024-04-17 MED ORDER — LIDOCAINE HCL 2 % IJ SOLN
20.0000 mL | Freq: Once | INTRAMUSCULAR | Status: AC
  Administered 2024-04-17: 400 mg
  Filled 2024-04-17: qty 20

## 2024-04-17 MED ORDER — PENTAFLUOROPROP-TETRAFLUOROETH EX AERO
1.0000 | INHALATION_SPRAY | Freq: Once | CUTANEOUS | Status: AC
  Administered 2024-04-17: 1 via TOPICAL

## 2024-04-17 NOTE — Progress Notes (Signed)
 Safety precautions to be maintained throughout the outpatient stay will include: orient to surroundings, keep bed in low position, maintain call bell within reach at all times, provide assistance with transfer out of bed and ambulation.

## 2024-04-18 ENCOUNTER — Telehealth: Payer: Self-pay | Admitting: *Deleted

## 2024-04-18 NOTE — Telephone Encounter (Signed)
 Post procedure call;  patient reports that she is doing well.

## 2024-05-01 ENCOUNTER — Ambulatory Visit: Admitting: Student in an Organized Health Care Education/Training Program

## 2024-05-02 ENCOUNTER — Ambulatory Visit: Admitting: Physician Assistant

## 2024-05-03 ENCOUNTER — Telehealth

## 2024-05-16 ENCOUNTER — Ambulatory Visit: Admitting: Family Medicine

## 2024-06-12 ENCOUNTER — Encounter: Admitting: Nurse Practitioner

## 2024-09-11 ENCOUNTER — Ambulatory Visit: Admitting: Dermatology
